# Patient Record
Sex: Female | Born: 1985 | Race: Black or African American | Hispanic: No | Marital: Single | Smoking: Never smoker
Health system: Southern US, Community
[De-identification: ages and names within clinical notes are randomized; demographics above are authoritative.]

## PROBLEM LIST (undated history)

## (undated) DIAGNOSIS — E039 Hypothyroidism, unspecified: Secondary | ICD-10-CM

## (undated) DIAGNOSIS — D649 Anemia, unspecified: Secondary | ICD-10-CM

## (undated) DIAGNOSIS — F419 Anxiety disorder, unspecified: Secondary | ICD-10-CM

## (undated) DIAGNOSIS — C541 Malignant neoplasm of endometrium: Secondary | ICD-10-CM

## (undated) DIAGNOSIS — G8929 Other chronic pain: Secondary | ICD-10-CM

## (undated) DIAGNOSIS — M549 Dorsalgia, unspecified: Secondary | ICD-10-CM

## (undated) DIAGNOSIS — G43909 Migraine, unspecified, not intractable, without status migrainosus: Secondary | ICD-10-CM

## (undated) HISTORY — PX: ELBOW FRACTURE SURGERY: SHX616

## (undated) HISTORY — PX: ABDOMINAL HYSTERECTOMY: SHX81

---
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
## 2005-01-21 ENCOUNTER — Observation Stay: Payer: Self-pay

## 2005-02-03 ENCOUNTER — Observation Stay: Payer: Self-pay

## 2005-02-18 ENCOUNTER — Observation Stay: Payer: Self-pay

## 2005-03-08 ENCOUNTER — Observation Stay: Payer: Self-pay | Admitting: Obstetrics and Gynecology

## 2005-03-14 ENCOUNTER — Observation Stay: Payer: Self-pay | Admitting: Obstetrics and Gynecology

## 2005-03-16 ENCOUNTER — Observation Stay: Payer: Self-pay | Admitting: Obstetrics and Gynecology

## 2005-03-19 ENCOUNTER — Observation Stay: Payer: Self-pay | Admitting: Obstetrics and Gynecology

## 2005-03-22 ENCOUNTER — Observation Stay: Payer: Self-pay

## 2005-03-24 ENCOUNTER — Observation Stay: Payer: Self-pay | Admitting: Obstetrics and Gynecology

## 2005-03-25 ENCOUNTER — Inpatient Hospital Stay: Payer: Self-pay | Admitting: Obstetrics and Gynecology

## 2005-09-21 ENCOUNTER — Emergency Department: Payer: Self-pay | Admitting: Emergency Medicine

## 2006-01-20 ENCOUNTER — Emergency Department: Payer: Self-pay | Admitting: Unknown Physician Specialty

## 2006-02-01 ENCOUNTER — Emergency Department: Payer: Self-pay | Admitting: Emergency Medicine

## 2006-02-02 ENCOUNTER — Ambulatory Visit: Payer: Self-pay | Admitting: Emergency Medicine

## 2006-02-27 ENCOUNTER — Observation Stay: Payer: Self-pay | Admitting: Obstetrics and Gynecology

## 2006-10-15 ENCOUNTER — Emergency Department: Payer: Self-pay | Admitting: Emergency Medicine

## 2007-01-02 ENCOUNTER — Emergency Department: Payer: Self-pay

## 2007-03-17 ENCOUNTER — Emergency Department: Payer: Self-pay | Admitting: Emergency Medicine

## 2007-04-03 ENCOUNTER — Emergency Department: Payer: Self-pay | Admitting: Emergency Medicine

## 2007-06-14 ENCOUNTER — Emergency Department: Payer: Self-pay | Admitting: Emergency Medicine

## 2007-06-29 ENCOUNTER — Observation Stay: Payer: Self-pay | Admitting: Obstetrics and Gynecology

## 2007-10-13 ENCOUNTER — Observation Stay: Payer: Self-pay

## 2007-11-01 ENCOUNTER — Inpatient Hospital Stay: Payer: Self-pay

## 2008-07-11 IMAGING — US US OB < 14 WEEKS - US OB TV
1 series · 17 of 28 positions shown · non-contrast
Comparison: none

REASON FOR EXAM: pelvic pain - 8 weeks pregnant - no bleeding
COMMENTS:

[Series 1: us ob < 14 weeks - us ob tv · 17 of 32 slices shown]
[im 1/32]
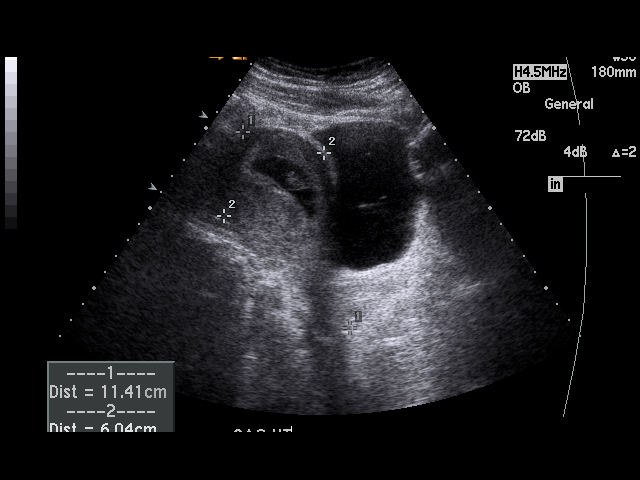
[im 3/32]
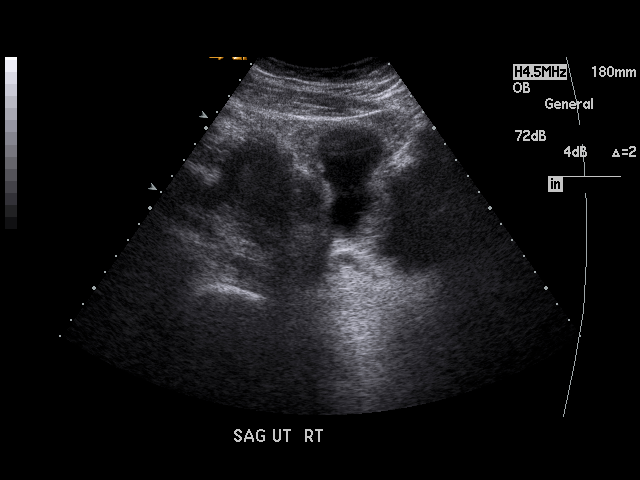
[im 5/32]
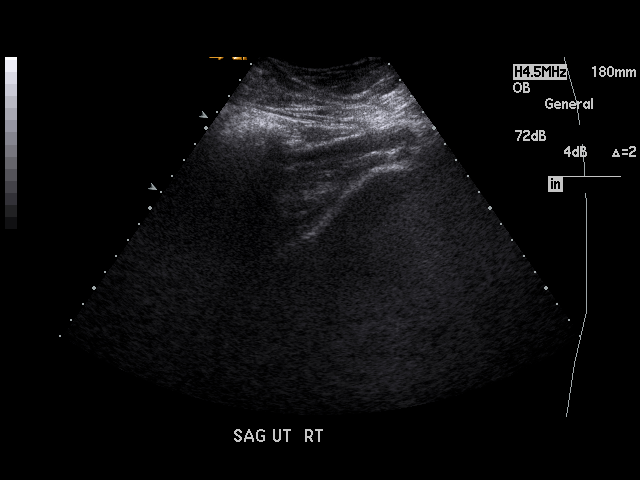
[im 6/32]
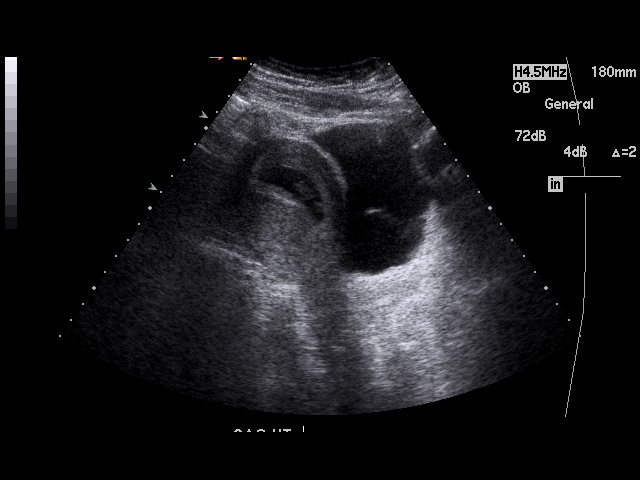
[im 9/32]
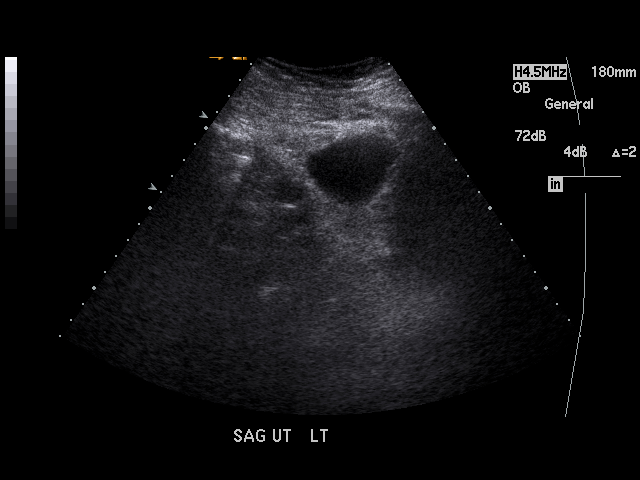
[im 11/32]
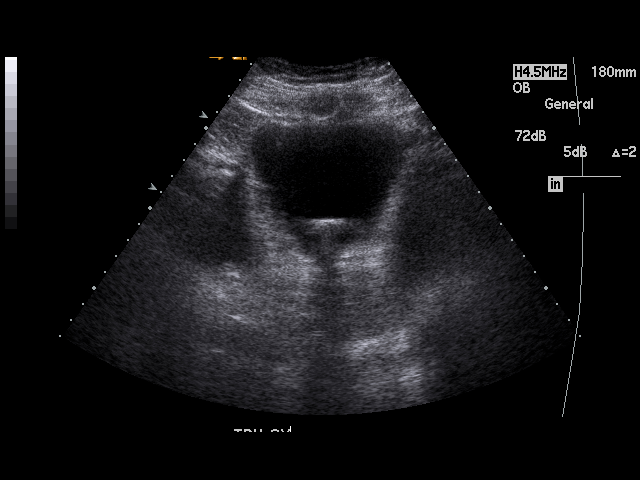
[im 12/32]
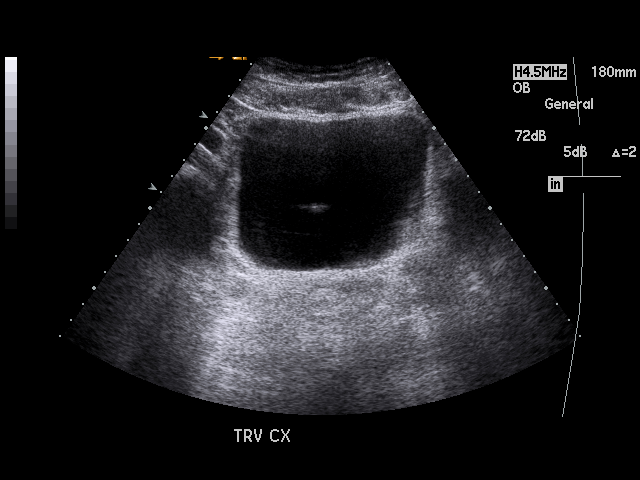
[im 14/32]
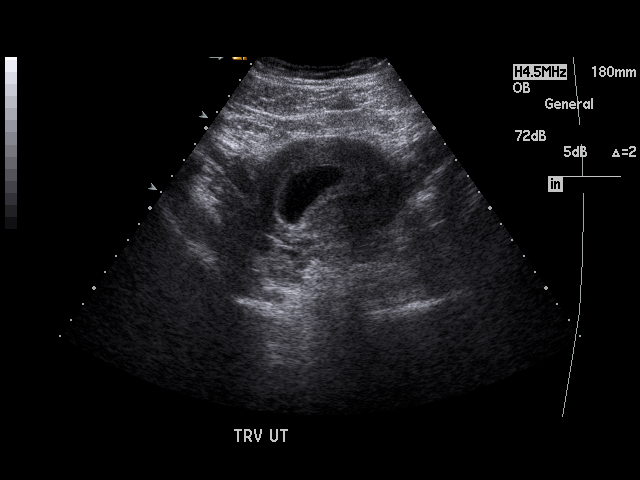
[im 17/32]
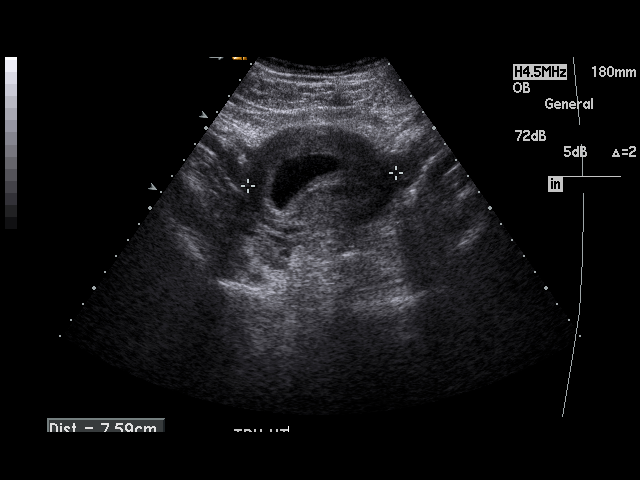
[im 18/32]
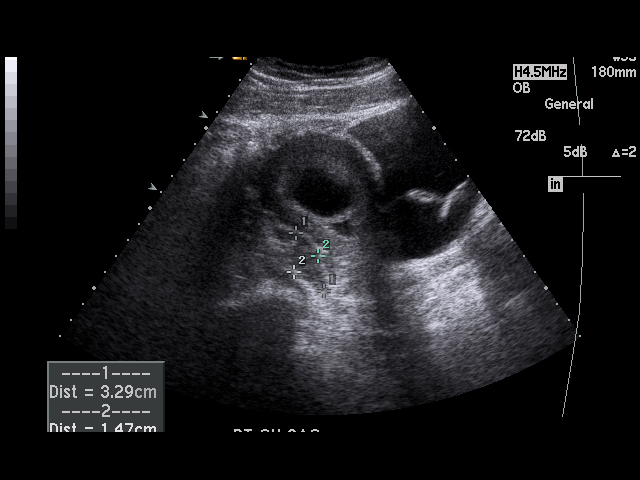
[im 20/32]
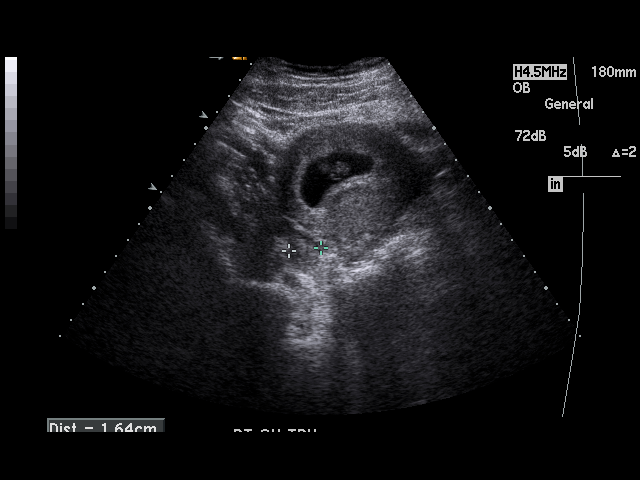
[im 21/32]
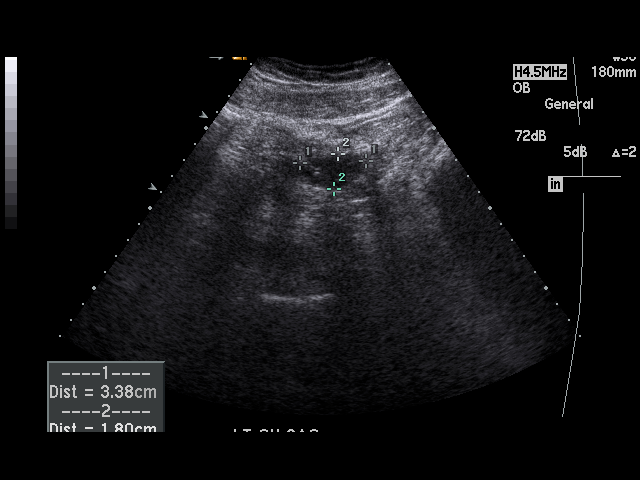
[im 23/32]
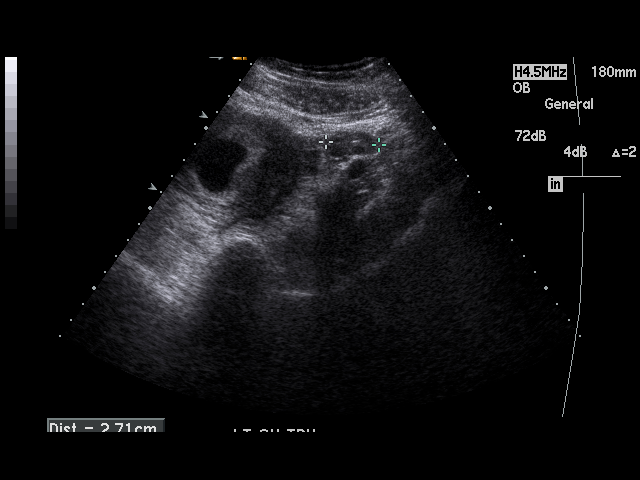
[im 26/32]
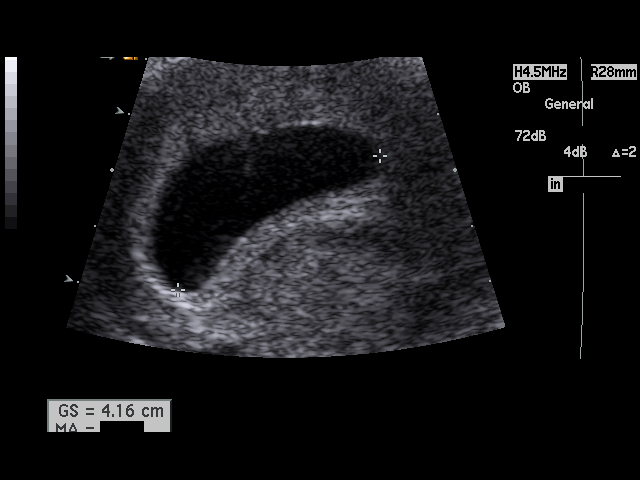
[im 27/32]
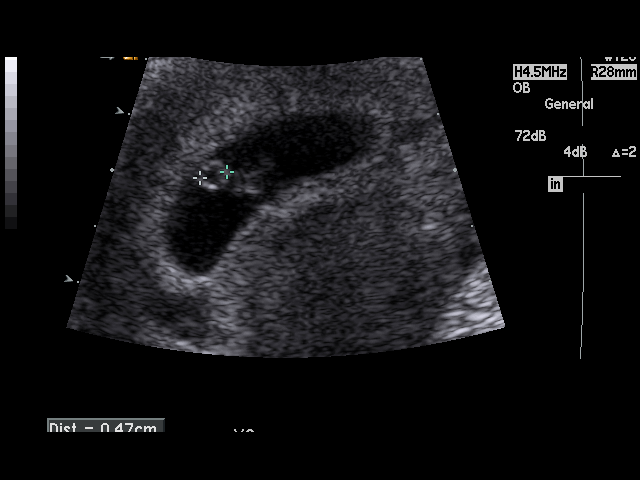
[im 29/32]
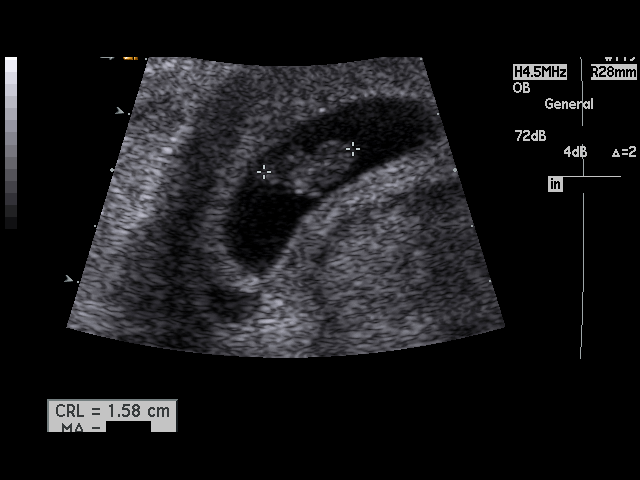
[im 32/32]
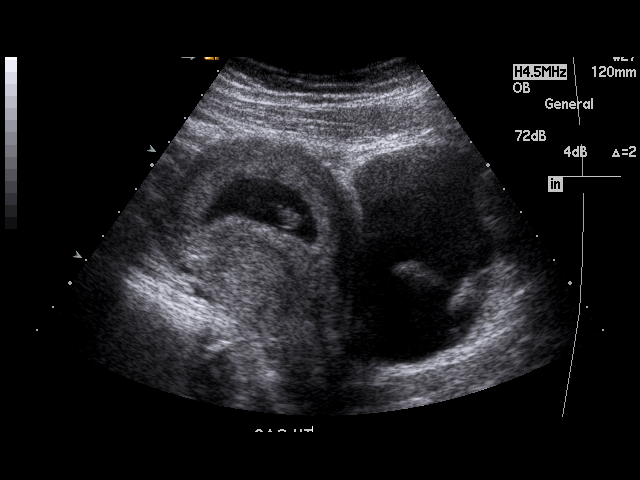

[17 of 28 positions shown; findings below may reference images not displayed]

PROCEDURE:     US  - US OB LESS THAN 14 WEEKS  - April 03, 2007  [DATE]

RESULT:       An intrauterine gestational sac is appreciated demonstrating a
single viable intrauterine pregnancy with a crown-rump length of 1.59 cm
corresponding to 8 weeks 0 days. Mean sac diameter is 4.21 cm corresponding
to 9 weeks 4 days. Yolk sac diameter is 4.0 cm. Estimated fetal heart rate
is 150 beats per minute.  The RIGHT ovary measures 3.29 x 1.47 x 1.64 cm and
the LEFT ovary measures 3.38 x 1.8 x 2.7 cm. There does not appear to be
evidence of pelvic masses, free fluid or drainable loculated fluid
collections.
IMPRESSION: Single viable intrauterine pregnancy with an estimated gestational age of 8
weeks 6 days.

Dr. Jama of the Emergency Department was informed of these findings at
the time of the initial interpretation.

## 2008-09-25 ENCOUNTER — Emergency Department: Payer: Self-pay | Admitting: Emergency Medicine

## 2008-10-19 ENCOUNTER — Encounter: Payer: Self-pay | Admitting: Maternal & Fetal Medicine

## 2008-11-06 ENCOUNTER — Emergency Department: Payer: Self-pay | Admitting: Emergency Medicine

## 2008-11-24 ENCOUNTER — Ambulatory Visit: Payer: Self-pay | Admitting: Obstetrics & Gynecology

## 2008-11-28 ENCOUNTER — Observation Stay: Payer: Self-pay

## 2008-12-27 ENCOUNTER — Observation Stay: Payer: Self-pay | Admitting: Obstetrics and Gynecology

## 2009-03-11 ENCOUNTER — Observation Stay: Payer: Self-pay | Admitting: Unknown Physician Specialty

## 2009-04-01 ENCOUNTER — Observation Stay: Payer: Self-pay

## 2009-04-06 ENCOUNTER — Observation Stay: Payer: Self-pay | Admitting: Obstetrics and Gynecology

## 2009-04-10 ENCOUNTER — Observation Stay: Payer: Self-pay

## 2009-04-16 ENCOUNTER — Inpatient Hospital Stay: Payer: Self-pay | Admitting: Obstetrics & Gynecology

## 2010-04-27 ENCOUNTER — Emergency Department: Payer: Self-pay | Admitting: Emergency Medicine

## 2010-06-23 ENCOUNTER — Emergency Department: Payer: Self-pay | Admitting: Emergency Medicine

## 2010-07-15 ENCOUNTER — Ambulatory Visit: Payer: Self-pay | Admitting: Family Medicine

## 2010-10-03 ENCOUNTER — Ambulatory Visit: Payer: Self-pay | Admitting: Family Medicine

## 2010-10-18 ENCOUNTER — Observation Stay: Payer: Self-pay | Admitting: Obstetrics and Gynecology

## 2010-11-05 ENCOUNTER — Observation Stay: Payer: Self-pay

## 2010-12-01 ENCOUNTER — Observation Stay: Payer: Self-pay

## 2010-12-09 ENCOUNTER — Observation Stay: Payer: Self-pay | Admitting: Obstetrics & Gynecology

## 2010-12-10 ENCOUNTER — Inpatient Hospital Stay: Payer: Self-pay | Admitting: Obstetrics and Gynecology

## 2010-12-13 LAB — PATHOLOGY REPORT

## 2011-10-05 ENCOUNTER — Emergency Department: Payer: Self-pay | Admitting: *Deleted

## 2011-10-05 LAB — PREGNANCY, URINE: Pregnancy Test, Urine: NEGATIVE m[IU]/mL

## 2011-10-08 ENCOUNTER — Ambulatory Visit: Payer: Self-pay | Admitting: Urology

## 2011-10-27 ENCOUNTER — Ambulatory Visit: Payer: Self-pay | Admitting: Family Medicine

## 2012-02-23 ENCOUNTER — Encounter: Payer: Self-pay | Admitting: Family Medicine

## 2012-05-19 ENCOUNTER — Ambulatory Visit: Payer: Self-pay | Admitting: Pain Medicine

## 2012-05-31 ENCOUNTER — Encounter: Payer: Self-pay | Admitting: Family Medicine

## 2012-07-03 ENCOUNTER — Emergency Department: Payer: Self-pay | Admitting: Emergency Medicine

## 2012-10-14 ENCOUNTER — Emergency Department: Payer: Self-pay | Admitting: Emergency Medicine

## 2012-10-29 ENCOUNTER — Emergency Department: Payer: Self-pay | Admitting: Emergency Medicine

## 2014-06-29 ENCOUNTER — Emergency Department: Payer: Self-pay | Admitting: Emergency Medicine

## 2014-07-05 ENCOUNTER — Ambulatory Visit: Payer: Self-pay | Admitting: Specialist

## 2014-10-08 NOTE — Op Note (Signed)
PATIENT NAME:  Christine Anderson, Christine Anderson MR#:  [id number] DATE OF BIRTH:  1985/11/11  DATE OF PROCEDURE:  07/05/2014  PREOPERATIVE DIAGNOSIS: Displaced right olecranon fracture.  POSTOPERATIVE DIAGNOSIS: Displaced right olecranon fracture.    PROCEDURE PERFORMED: Open reduction and internal fixation, right olecranon fracture.   SURGEON: Park Breed, MD   ANESTHESIA: General endotracheal.   COMPLICATIONS: None.   DRAINS: One vessel loop.   ESTIMATED BLOOD LOSS: Minimal.   REPLACED: None.   DESCRIPTION OF PROCEDURE: The patient was brought to the Operating Room where she underwent satisfactory general endotracheal anesthesia in the supine position. The right arm was prepped and draped in sterile fashion. An Esmarch was applied. The tourniquet was inflated to 250 mmHg. Tourniquet time was 65 minutes. A longitudinal incision was made along the proximal posterior border of the ulna, extending proximally and radially around the olecranon, and then toward the midline above the elbow. Dissection was carried out sharply through the subcutaneous tissue, which was quite edematous. Dissection was brought down to the posterior border of the ulna, and the muscle was released medially and laterally. Periosteal elevator was used to carefully elevate this, protecting the nerve. The fracture site was identified and hematoma was removed with irrigation, rongeur, and curettes. Once this was completed, the fracture reduction was undertaken. The fracture fortunately was not very comminuted, and was reducible and in good position. A large bone-holding clamp was used to hold this in place.   Two 0.063 K wires were then drilled through the back of the olecranon, down the shaft of the ulna. When these were seen to be in good position under fluoroscopic visualization, a drill hole was made in the ulnar shaft, about 2-1/2 inches distal to the fracture line medially and laterally. A 20-gauge wire was passed through these  holes, and then brought up in a figure-of-eight tension-band fashion around the K wires proximally. Two wire loops were created medially and laterally along the olecranon, and the wire was gradually tightened while the fracture was held reduced. The wire was carefully tighten, both medial and laterally, and it was quite snug. This allowed good flexion of the elbow and extension without significant loosening of the wire. The K wires were then bent down and cut, and a mallet used to drive the wires down into the soft tissues so they were not exposed. Fluoroscopy showed the fracture to be well reduced, and the wires to be in good position.   The wounds were then irrigated. The subcutaneous tissue was closed with 2-0 and 3-0 Vicryl, and the skin was closed with staples over a vessel loop drain.  Marcaine 0.5% was placed in the wound. A dry, sterile compression dressing with a posterior splint was applied. The tourniquet was deflated with good return of blood flow to the hand. The patient was placed in a sling, and awakened and taken to recovery in good condition.   The patient was given pain medication yesterday in the office, and in addition will be given meloxicam and gabapentin to take home with him today, as well.    ____________________________ Park Breed, MD hem:MT D: 07/05/2014 10:55:19 ET T: 07/05/2014 14:37:03 ET JOB#: [id number]  cc: Park Breed, MD, <Dictator> Park Breed MD ELECTRONICALLY SIGNED 07/07/2014 14:43

## 2015-02-21 ENCOUNTER — Encounter: Payer: Self-pay | Admitting: Emergency Medicine

## 2015-02-21 ENCOUNTER — Emergency Department
Admission: EM | Admit: 2015-02-21 | Discharge: 2015-02-21 | Disposition: A | Discharge status: Home / Self Care | Payer: Medicaid Other | Attending: Emergency Medicine | Admitting: Emergency Medicine

## 2015-02-21 DIAGNOSIS — E669 Obesity, unspecified: Secondary | ICD-10-CM | POA: Insufficient documentation

## 2015-02-21 DIAGNOSIS — L03115 Cellulitis of right lower limb: Secondary | ICD-10-CM | POA: Insufficient documentation

## 2015-02-21 DIAGNOSIS — L03116 Cellulitis of left lower limb: Secondary | ICD-10-CM | POA: Diagnosis not present

## 2015-02-21 DIAGNOSIS — M79605 Pain in left leg: Secondary | ICD-10-CM | POA: Diagnosis present

## 2015-02-21 HISTORY — DX: Other chronic pain: G89.29

## 2015-02-21 HISTORY — DX: Dorsalgia, unspecified: M54.9

## 2015-02-21 LAB — CBC WITH DIFFERENTIAL/PLATELET
BASOS PCT: 0 %
Basophils Absolute: 0 10*3/uL (ref 0–0.1)
EOS ABS: 0.1 10*3/uL (ref 0–0.7)
EOS PCT: 1 %
HCT: 35.9 % (ref 35.0–47.0)
Hemoglobin: 11.3 g/dL — ABNORMAL LOW (ref 12.0–16.0)
LYMPHS ABS: 2.2 10*3/uL (ref 1.0–3.6)
Lymphocytes Relative: 26 %
MCH: 24.6 pg — AB (ref 26.0–34.0)
MCHC: 31.3 g/dL — AB (ref 32.0–36.0)
MCV: 78.6 fL — ABNORMAL LOW (ref 80.0–100.0)
MONOS PCT: 7 %
Monocytes Absolute: 0.6 10*3/uL (ref 0.2–0.9)
NEUTROS PCT: 66 %
Neutro Abs: 5.6 10*3/uL (ref 1.4–6.5)
PLATELETS: 285 10*3/uL (ref 150–440)
RBC: 4.57 MIL/uL (ref 3.80–5.20)
RDW: 14 % (ref 11.5–14.5)
WBC: 8.5 10*3/uL (ref 3.6–11.0)

## 2015-02-21 LAB — BASIC METABOLIC PANEL
Anion gap: 7 (ref 5–15)
BUN: 9 mg/dL (ref 6–20)
CO2: 27 mmol/L (ref 22–32)
CREATININE: 0.86 mg/dL (ref 0.44–1.00)
Calcium: 9.2 mg/dL (ref 8.9–10.3)
Chloride: 104 mmol/L (ref 101–111)
Glucose, Bld: 95 mg/dL (ref 65–99)
Potassium: 3.5 mmol/L (ref 3.5–5.1)
SODIUM: 138 mmol/L (ref 135–145)

## 2015-02-21 MED ORDER — ACETAMINOPHEN 500 MG PO TABS
ORAL_TABLET | ORAL | Status: DC
Start: 2015-02-21 — End: 2015-02-22
  Filled 2015-02-21: qty 2

## 2015-02-21 MED ORDER — OXYCODONE-ACETAMINOPHEN 5-325 MG PO TABS
2.0000 | ORAL_TABLET | Freq: Once | ORAL | Status: DC
  Filled 2015-02-21: qty 2

## 2015-02-21 MED ORDER — DOXYCYCLINE HYCLATE 100 MG PO CAPS: ORAL_CAPSULE | ORAL | Status: DC

## 2015-02-21 MED ORDER — CEPHALEXIN 500 MG PO CAPS: 500.0000 mg | ORAL_CAPSULE | Freq: Four times a day (QID) | ORAL | Status: DC

## 2015-02-21 MED ORDER — DOXYCYCLINE HYCLATE 100 MG PO TABS
100.0000 mg | ORAL_TABLET | Freq: Once | ORAL | Status: AC
  Administered 2015-02-21: 100 mg via ORAL
  Filled 2015-02-21 (×2): qty 1

## 2015-02-21 MED ORDER — CEPHALEXIN 500 MG PO CAPS
500.0000 mg | ORAL_CAPSULE | Freq: Once | ORAL | Status: AC
  Administered 2015-02-21: 500 mg via ORAL
  Filled 2015-02-21 (×2): qty 1

## 2015-02-21 MED ORDER — ACETAMINOPHEN 500 MG PO TABS
1000.0000 mg | ORAL_TABLET | Freq: Once | ORAL | Status: AC
  Administered 2015-02-21: 1000 mg via ORAL

## 2015-02-21 NOTE — Discharge Instructions (Signed)
As we discussed, we believe that the redness and pain in your legs are caused by a mild skin infection.  I prescribed 2 antibiotics for human encouraged to take both of them for the full course of treatment.  Please take your regular pain medication and follow up with your regular doctor as soon as possible.  If you find that her symptoms are getting worse, or if he develop new symptoms that concern you, please return immediately to the emergency department.  As read through the included information for additional recommendations for treatments.   Cellulitis Cellulitis is an infection of the skin and the tissue under the skin. The infected area is usually red and tender. This happens most often in the arms and lower legs. HOME CARE   Take your antibiotic medicine as told. Finish the medicine even if you start to feel better.  Keep the infected arm or leg raised (elevated).  Put a warm cloth on the area up to 4 times per day.  Only take medicines as told by your doctor.  Keep all doctor visits as told. GET HELP IF:  You see red streaks on the skin coming from the infected area.  Your red area gets bigger or turns a dark color.  Your bone or joint under the infected area is painful after the skin heals.  Your infection comes back in the same area or different area.  You have a puffy (swollen) bump in the infected area.  You have new symptoms.  You have a fever. GET HELP RIGHT AWAY IF:   You feel very sleepy.  You throw up (vomit) or have watery poop (diarrhea).  You feel sick and have muscle aches and pains. MAKE SURE YOU:   Understand these instructions.  Will watch your condition.  Will get help right away if you are not doing well or get worse. Document Released: 11/12/2007 Document Revised: 10/10/2013 Document Reviewed: 08/11/2011 [facility] Patient Information 2015 [place], [place]. This information is not intended to replace advice given to you by your health care  provider. Make sure you discuss any questions you have with your health care provider.

## 2015-02-21 NOTE — ED Notes (Signed)
Pt to triage via w/c with no distress noted; pt reports redness to lower legs x 2 days with pain; denies any known injury; denies any accomp symtpoms

## 2015-02-21 NOTE — ED Provider Notes (Signed)
[facility] Emergency Department Provider Note  ____________________________________________  Time seen: Approximately 8:09 PM  I have reviewed the triage vital signs and the nursing notes.   HISTORY  Chief Complaint Leg Pain    HPI HADLIE GIPSON is a 29 y.o. female with history of chronic back pain who receives her pain medicines at the [facility] clinic and a history of obesity but otherwise is healthy who presents with 2 days of gradual onset but worsening redness to both of her lower legs with accompanying pain and warmth.  She has never had this happen before.  She states this started in her lower legs but has spread.  She has no pain in the backs of her knees and his never had any blood clots.  She denies fever/chills, chest pain, shortness of breath, nausea/vomiting, abdominal pain.  She notes that it did start after she shaved her legs but does not remember cutting or nicking herself.   Past Medical History  Diagnosis Date  . Chronic back pain     There are no active problems to display for this patient.   History reviewed. No pertinent past surgical history.  Current Outpatient Rx  Name  Route  Sig  Dispense  Refill  . cephALEXin (KEFLEX) 500 MG capsule   Oral   Take 1 capsule (500 mg total) by mouth 4 (four) times daily.   40 capsule   0   . doxycycline (VIBRAMYCIN) 100 MG capsule      Take 1 capsule (100 mg) by mouth twice daily for 10 days.   20 capsule   0     Allergies Flexeril; Septra; and Tramadol  No family history on file.  Social History Social History  Substance Use Topics  . Smoking status: Never Smoker   . Smokeless tobacco: None  . Alcohol Use: No    Review of Systems Constitutional: No fever/chills Eyes: No visual changes. ENT: No sore throat. Cardiovascular: Denies chest pain. Respiratory: Denies shortness of breath. Gastrointestinal: No abdominal pain.  No nausea, no vomiting.  No diarrhea.  No  constipation. Genitourinary: Negative for dysuria. Musculoskeletal: pain in her lower extremities with redness Skin: erythema in her lower extremities Neurological: Negative for headaches, focal weakness or numbness.  10-point ROS otherwise negative.  ____________________________________________   PHYSICAL EXAM:  VITAL SIGNS: ED Triage Vitals  Enc Vitals Group     BP 02/21/15 1912 108/51 mmHg     Pulse Rate 02/21/15 1912 84     Resp 02/21/15 1912 20     Temp 02/21/15 1912 97.6 F (36.4 C)     Temp Source 02/21/15 1912 Oral     SpO2 02/21/15 1912 99 %     Weight 02/21/15 1912 280 lb (127.007 kg)     Height 02/21/15 1912 5\' 8"  (1.727 m)     Head Cir --      Peak Flow --      Pain Score 02/21/15 1914 10     Pain Loc --      Pain Edu? --      Excl. in [place]? --     Constitutional: Alert and oriented. Well appearing and in no acute distress. Eyes: Conjunctivae are normal. PERRL. EOMI. Head: Atraumatic. Nose: No congestion/rhinnorhea. Mouth/Throat: Mucous membranes are moist.  Oropharynx non-erythematous. Neck: No stridor.   Cardiovascular: Normal rate, regular rhythm. Grossly normal heart sounds.  Good peripheral circulation. Respiratory: Normal respiratory effort.  No retractions. Lungs CTAB. Gastrointestinal: obese, Soft and nontender.  No distention. No abdominal bruits. No CVA tenderness. Musculoskeletal: no obvious edema in her lower extremities.mild tenderness to palpation but soft and easily compressible compartments.  No palpable cords or knots along her lower extremity venous distribution or popliteal regions. Neurologic:  Normal speech and language. No gross focal neurologic deficits are appreciated.  Skin:  Patchy erythema and warmth in her lower extremities including parts of her feet and lower extremities but not extending proximally past her knee consistent with cellulitis.  Pain is minimal and not consistent with necrotizing fasciitis. Psychiatric: Mood and affect  are normal. Speech and behavior are normal.  ____________________________________________   LABS (all labs ordered are listed, but only abnormal results are displayed)  Labs Reviewed  CBC WITH DIFFERENTIAL/PLATELET - Abnormal; Notable for the following:    Hemoglobin 11.3 (*)    MCV 78.6 (*)    MCH 24.6 (*)    MCHC 31.3 (*)    All other components within normal limits  BASIC METABOLIC PANEL   ____________________________________________  EKG  Not indicated ____________________________________________  RADIOLOGY   Not indicated  ____________________________________________   PROCEDURES  Procedure(s) performed: None  Critical Care performed: No ____________________________________________   INITIAL IMPRESSION / ASSESSMENT AND PLAN / ED COURSE  Pertinent labs & imaging results that were available during my care of the patient were reviewed by me and considered in my medical decision making (see chart for details).  Though it is unusual to have cellulitis in both extremities is not unheard of.  There is nothing about her presentation with normal vital signs and no fever and minimal pain to suggest necrotizing fasciitis.  There is also nothing to suggest that this is the result of a DVT.  I will treat her empirically with broad coverage for strep and staph species including MRSA with Keflex and doxycycline (she has a listed allergy to sulfa drugs).  I gave her my usual customary return precautions.  Given that she is on chronic pain medicine from a primary care doctor I will not prescribe pain medication at this time but I gave her a dose while in the emergency department.  I drew baseline labs in case she has to come back and I did give her my usual and customary return precautions.  ____________________________________________  FINAL CLINICAL IMPRESSION(S) / ED DIAGNOSES  Final diagnoses:  Cellulitis of leg, left  Cellulitis of leg, right      NEW MEDICATIONS  STARTED DURING THIS VISIT:  New Prescriptions   CEPHALEXIN (KEFLEX) 500 MG CAPSULE    Take 1 capsule (500 mg total) by mouth 4 (four) times daily.   DOXYCYCLINE (VIBRAMYCIN) 100 MG CAPSULE    Take 1 capsule (100 mg) by mouth twice daily for 10 days.     Christine Kehr, MD 02/21/15 2110

## 2015-08-30 ENCOUNTER — Encounter: Payer: Self-pay | Admitting: Emergency Medicine

## 2015-08-30 ENCOUNTER — Emergency Department
Admission: EM | Admit: 2015-08-30 | Discharge: 2015-08-30 | Disposition: A | Discharge status: Home / Self Care | Payer: Medicaid Other | Attending: Emergency Medicine | Admitting: Emergency Medicine

## 2015-08-30 DIAGNOSIS — M549 Dorsalgia, unspecified: Secondary | ICD-10-CM | POA: Diagnosis not present

## 2015-08-30 DIAGNOSIS — K5289 Other specified noninfective gastroenteritis and colitis: Secondary | ICD-10-CM | POA: Insufficient documentation

## 2015-08-30 DIAGNOSIS — Z79899 Other long term (current) drug therapy: Secondary | ICD-10-CM | POA: Diagnosis not present

## 2015-08-30 DIAGNOSIS — G8929 Other chronic pain: Secondary | ICD-10-CM | POA: Diagnosis not present

## 2015-08-30 DIAGNOSIS — K529 Noninfective gastroenteritis and colitis, unspecified: Secondary | ICD-10-CM

## 2015-08-30 DIAGNOSIS — R112 Nausea with vomiting, unspecified: Secondary | ICD-10-CM | POA: Diagnosis present

## 2015-08-30 MED ORDER — ONDANSETRON 4 MG PO TBDP
4.0000 mg | ORAL_TABLET | Freq: Once | ORAL | Status: AC
  Administered 2015-08-30: 4 mg via ORAL
  Filled 2015-08-30: qty 1

## 2015-08-30 MED ORDER — ONDANSETRON HCL 4 MG PO TABS: 4.0000 mg | ORAL_TABLET | Freq: Every day | ORAL | Status: AC | PRN

## 2015-08-30 MED ORDER — IBUPROFEN 800 MG PO TABS: 800.0000 mg | ORAL_TABLET | Freq: Three times a day (TID) | ORAL | Status: DC | PRN

## 2015-08-30 MED ORDER — IBUPROFEN 800 MG PO TABS
800.0000 mg | ORAL_TABLET | Freq: Once | ORAL | Status: AC
  Administered 2015-08-30: 800 mg via ORAL
  Filled 2015-08-30: qty 1

## 2015-08-30 NOTE — ED Notes (Signed)
States she developed the n/v sx's 2 days ago  Unsure of fever.. States she vomited around 10 am this am

## 2015-08-30 NOTE — ED Notes (Signed)
Presents with a 2 day hx of vomiting   Last time vomited was this am   Having some stomach cramping

## 2015-08-30 NOTE — Discharge Instructions (Signed)
Continue nausea medicine as directed. Begin ibuprofen as needed. Try to drink liquids and stay on bland diet until symptoms resolve. Return to emergency for any worsening symptoms.

## 2015-08-30 NOTE — ED Provider Notes (Signed)
[facility] Emergency Department Provider Note  ____________________________________________  Time seen: Approximately 7:40 PM  I have reviewed the triage vital signs and the nursing notes.   HISTORY  Chief Complaint Emesis    HPI Christine Anderson is a 30 y.o. female who presents with 2 days of nausea vomiting and diarrhea. Last episode of vomiting was earlier today. She also had diarrhea earlier today. She has occasional abdominal cramping. Has felt feverish at times. She also has a headache. No cough, congestion.   Past Medical History  Diagnosis Date  . Chronic back pain     There are no active problems to display for this patient.   History reviewed. No pertinent past surgical history.  Current Outpatient Rx  Name  Route  Sig  Dispense  Refill  . levothyroxine (SYNTHROID, LEVOTHROID) 100 MCG tablet   Oral   Take 100 mcg by mouth daily before breakfast.         . oxyCODONE-acetaminophen (PERCOCET) 7.5-325 MG tablet   Oral   Take 1 tablet by mouth every 4 (four) hours as needed for severe pain.         Marland Kitchen sertraline (ZOLOFT) 25 MG tablet   Oral   Take 25 mg by mouth daily.         . SUMAtriptan (IMITREX) 100 MG tablet   Oral   Take 100 mg by mouth every 2 (two) hours as needed for migraine. May repeat in 2 hours if headache persists or recurs.         . cephALEXin (KEFLEX) 500 MG capsule   Oral   Take 1 capsule (500 mg total) by mouth 4 (four) times daily.   40 capsule   0   . doxycycline (VIBRAMYCIN) 100 MG capsule      Take 1 capsule (100 mg) by mouth twice daily for 10 days.   20 capsule   0   . ibuprofen (ADVIL,MOTRIN) 800 MG tablet   Oral   Take 1 tablet (800 mg total) by mouth every 8 (eight) hours as needed.   15 tablet   0   . ondansetron (ZOFRAN) 4 MG tablet   Oral   Take 1 tablet (4 mg total) by mouth daily as needed for nausea or vomiting.   10 tablet   0     Allergies Flexeril; Septra; and  Tramadol  No family history on file.  Social History Social History  Substance Use Topics  . Smoking status: Never Smoker   . Smokeless tobacco: None  . Alcohol Use: No    Review of Systems Constitutional: fever/chills Eyes: No visual changes. ENT: No sore throat. Cardiovascular: Denies chest pain. Respiratory: Denies shortness of breath. Gastrointestinal per history of present illness Genitourinary: Negative for dysuria. Musculoskeletal: Negative for back pain. Skin: Negative for rash. Neurological: Negative for focal weakness or numbness. 10-point ROS otherwise negative.  ____________________________________________   PHYSICAL EXAM:  VITAL SIGNS: ED Triage Vitals  Enc Vitals Group     BP 08/30/15 1856 134/82 mmHg     Pulse Rate 08/30/15 1856 94     Resp 08/30/15 1856 18     Temp 08/30/15 1856 98.4 F (36.9 C)     Temp Source 08/30/15 1856 Oral     SpO2 08/30/15 1856 98 %     Weight 08/30/15 1856 295 lb (133.811 kg)     Height 08/30/15 1856 5\' 8"  (1.727 m)     Head Cir --  Peak Flow --      Pain Score 08/30/15 1902 4     Pain Loc --      Pain Edu? --      Excl. in [place]? --     Constitutional: Alert and oriented. Well appearing and in no acute distress. Eyes: Conjunctivae are normal. PERRL. EOMI. Ears:  Clear with normal landmarks. No erythema. Head: Atraumatic. Nose: No congestion/rhinnorhea. Mouth/Throat: Mucous membranes are moist.  Oropharynx non-erythematous. No lesions. Neck:  Supple.  No adenopathy.   Cardiovascular: Normal rate, regular rhythm. Grossly normal heart sounds.  Good peripheral circulation. Respiratory: Normal respiratory effort.  No retractions. Lungs CTAB. Gastrointestinal: Soft and nontender. No distention. No abdominal bruits. No CVA tenderness. Musculoskeletal: Nml ROM of upper and lower extremity joints. Neurologic:  Normal speech and language. No gross focal neurologic deficits are appreciated. No gait instability. Skin:  Skin  is warm, dry and intact. No rash noted. Psychiatric: Mood and affect are normal. Speech and behavior are normal.  ____________________________________________   LABS (all labs ordered are listed, but only abnormal results are displayed)  Labs Reviewed - No data to display ____________________________________________  EKG   ____________________________________________  RADIOLOGY   ____________________________________________   PROCEDURES  Procedure(s) performed: None  Critical Care performed: No  ____________________________________________   INITIAL IMPRESSION / ASSESSMENT AND PLAN / ED COURSE  Pertinent labs & imaging results that were available during my care of the patient were reviewed by me and considered in my medical decision making (see chart for details).  30 year old with acute onset of nausea, vomiting, diarrhea. Improved. Given Zofran and ibuprofen emergency room. She is also given prescriptions for this. Return to emergency room if not improving. ____________________________________________   FINAL CLINICAL IMPRESSION(S) / ED DIAGNOSES  Final diagnoses:  Gastroenteritis      Mortimer Fries, PA-C 08/30/15 2059  Hinda Kehr, MD 08/31/15 1126

## 2015-09-24 ENCOUNTER — Emergency Department: Payer: No Typology Code available for payment source

## 2015-09-24 ENCOUNTER — Emergency Department
Admission: EM | Admit: 2015-09-24 | Discharge: 2015-09-24 | Disposition: A | Discharge status: Home / Self Care | Payer: No Typology Code available for payment source | Attending: Student | Admitting: Student

## 2015-09-24 ENCOUNTER — Encounter: Payer: Self-pay | Admitting: Emergency Medicine

## 2015-09-24 DIAGNOSIS — Y939 Activity, unspecified: Secondary | ICD-10-CM | POA: Diagnosis not present

## 2015-09-24 DIAGNOSIS — Y999 Unspecified external cause status: Secondary | ICD-10-CM | POA: Insufficient documentation

## 2015-09-24 DIAGNOSIS — S6000XA Contusion of unspecified finger without damage to nail, initial encounter: Secondary | ICD-10-CM

## 2015-09-24 DIAGNOSIS — S5011XA Contusion of right forearm, initial encounter: Secondary | ICD-10-CM | POA: Insufficient documentation

## 2015-09-24 DIAGNOSIS — S60221A Contusion of right hand, initial encounter: Secondary | ICD-10-CM | POA: Insufficient documentation

## 2015-09-24 DIAGNOSIS — Y929 Unspecified place or not applicable: Secondary | ICD-10-CM | POA: Insufficient documentation

## 2015-09-24 DIAGNOSIS — S59911A Unspecified injury of right forearm, initial encounter: Secondary | ICD-10-CM | POA: Diagnosis present

## 2015-09-24 MED ORDER — HYDROCODONE-ACETAMINOPHEN 5-325 MG PO TABS: 1.0000 | ORAL_TABLET | Freq: Once | ORAL | Status: DC

## 2015-09-24 MED ORDER — HYDROCODONE-ACETAMINOPHEN 5-325 MG PO TABS
ORAL_TABLET | ORAL | Status: DC
Start: 2015-09-24 — End: 2015-09-24
  Filled 2015-09-24: qty 1
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
## 2015-09-24 NOTE — Discharge Instructions (Signed)
Contusion A contusion is a deep bruise. Contusions happen when an injury causes bleeding under the skin. Symptoms of bruising include pain, swelling, and discolored skin. The skin may turn blue, purple, or yellow. HOME CARE   Rest the injured area.  If told, put ice on the injured area.  Put ice in a plastic bag.  Place a towel between your skin and the bag.  Leave the ice on for 20 minutes, 2-3 times per day.  If told, put light pressure (compression) on the injured area using an elastic bandage. Make sure the bandage is not too tight. Remove it and put it back on as told by your doctor.  If possible, raise (elevate) the injured area above the level of your heart while you are sitting or lying down.  Take over-the-counter and prescription medicines only as told by your doctor. GET HELP IF:  Your symptoms do not get better after several days of treatment.  Your symptoms get worse.  You have trouble moving the injured area. GET HELP RIGHT AWAY IF:   You have very bad pain.  You have a loss of feeling (numbness) in a hand or foot.  Your hand or foot turns pale or cold.   This information is not intended to replace advice given to you by your health care provider. Make sure you discuss any questions you have with your health care provider.   Document Released: 11/12/2007 Document Revised: 02/14/2015 Document Reviewed: 10/11/2014 Else[employer] Interactive Patient Education 2016 [employer]  Cryotherapy Cryotherapy is when you put ice on your injury. Ice helps lessen pain and puffiness (swelling) after an injury. Ice works the best when you start using it in the first 24 to 48 hours after an injury. HOME CARE  Put a dry or damp towel between the ice pack and your skin.  You may press gently on the ice pack.  Leave the ice on for no more than 10 to 20 minutes at a time.  Check your skin after 5 minutes to make sure your skin is okay.  Rest at least 20 minutes between ice  pack uses.  Stop using ice when your skin loses feeling (numbness).  Do not use ice on someone who cannot tell you when it hurts. This includes small children and people with memory problems (dementia). GET HELP RIGHT AWAY IF:  You have white spots on your skin.  Your skin turns blue or pale.  Your skin feels waxy or hard.  Your puffiness gets worse. MAKE SURE YOU:   Understand these instructions.  Will watch your condition.  Will get help right away if you are not doing well or get worse.   This information is not intended to replace advice given to you by your health care provider. Make sure you discuss any questions you have with your health care provider.   Document Released: 11/12/2007 Document Revised: 08/18/2011 Document Reviewed: 01/16/2011 Else[employer] Interactive Patient Education 2016 [place] taking your medication as your doctor has prescribed. Ice and elevate your hand and arm to reduce swelling. Wear sling for 2-3 days for supportive your arm. Follow-up with Dr. Sabra Heck if any continued problems with the arm or hand. You will need to call and make an appointment.
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
## 2016-04-18 ENCOUNTER — Emergency Department
Admission: EM | Admit: 2016-04-18 | Discharge: 2016-04-18 | Disposition: A | Discharge status: Home / Self Care | Payer: Medicaid Other | Attending: Emergency Medicine | Admitting: Emergency Medicine

## 2016-04-18 ENCOUNTER — Encounter: Payer: Self-pay | Admitting: Emergency Medicine

## 2016-04-18 DIAGNOSIS — M5441 Lumbago with sciatica, right side: Secondary | ICD-10-CM | POA: Diagnosis not present

## 2016-04-18 DIAGNOSIS — Z79899 Other long term (current) drug therapy: Secondary | ICD-10-CM | POA: Insufficient documentation

## 2016-04-18 DIAGNOSIS — M545 Low back pain: Secondary | ICD-10-CM | POA: Diagnosis present

## 2016-04-18 DIAGNOSIS — G8929 Other chronic pain: Secondary | ICD-10-CM

## 2016-04-18 HISTORY — DX: Migraine, unspecified, not intractable, without status migrainosus: G43.909

## 2016-04-18 MED ORDER — KETOROLAC TROMETHAMINE 10 MG PO TABS: 10.0000 mg | ORAL_TABLET | Freq: Four times a day (QID) | ORAL | 0 refills | Status: AC | PRN

## 2016-04-18 MED ORDER — KETOROLAC TROMETHAMINE 30 MG/ML IJ SOLN
30.0000 mg | Freq: Once | INTRAMUSCULAR | Status: DC
  Filled 2016-04-18: qty 1

## 2016-04-18 NOTE — ED Provider Notes (Signed)
[facility] Emergency Department Provider Note  ____________________________________________  Time seen: Approximately 4:44 PM  I have reviewed the triage vital signs and the nursing notes.   HISTORY  Chief Complaint Back Pain    HPI Christine Anderson is a 30 y.o. female , NAD, presents to the emergency department with 2-1/2 month history of lower back pain. Patient states she has chronic lower back pain with radiation into the right leg. Was being treated for such by her primary care provider and states she was receiving pain medications up until 3 months ago. States she was seen by a spine specialist yesterday and had an MRI completed but they did not give her any pain medications. States that her pain is worsening versus improving and she has not been able to sleep due to the pain. Denies any saddle paresthesias or loss of bowel or bladder control. No recent injuries or traumas. States she has been on multiple medications including steroids and muscle relaxers with nothing completely alleviating her pain.   Past Medical History:  Diagnosis Date  . Chronic back pain   . Migraines     There are no active problems to display for this patient.   History reviewed. No pertinent surgical history.  Prior to Admission medications   Medication Sig Start Date End Date Taking? Authorizing Provider  cephALEXin (KEFLEX) 500 MG capsule Take 1 capsule (500 mg total) by mouth 4 (four) times daily. 02/21/15   Hinda Kehr, MD  doxycycline (VIBRAMYCIN) 100 MG capsule Take 1 capsule (100 mg) by mouth twice daily for 10 days. 02/21/15   Hinda Kehr, MD  gabapentin (NEURONTIN) 400 MG capsule Take 400 mg by mouth 2 (two) times daily.    Historical Provider, MD  ibuprofen (ADVIL,MOTRIN) 800 MG tablet Take 1 tablet (800 mg total) by mouth every 8 (eight) hours as needed. 08/30/15   Mortimer Fries, PA-C  ketorolac (TORADOL) 10 MG tablet Take 1 tablet (10 mg total) by mouth every 6  (six) hours as needed for severe pain. Please take with food and may not take for longer than 5 days. Do not take with any other NSAIDS. May take Tylenol only while on this medication. 04/18/16 04/23/16  Khloi Rawl L Alora Gorey, PA-C  levothyroxine (SYNTHROID, LEVOTHROID) 100 MCG tablet Take 100 mcg by mouth daily before breakfast.    Historical Provider, MD  meloxicam (MOBIC) 15 MG tablet Take 15 mg by mouth daily.    Historical Provider, MD  ondansetron (ZOFRAN) 4 MG tablet Take 1 tablet (4 mg total) by mouth daily as needed for nausea or vomiting. 08/30/15 08/29/16  Mortimer Fries, PA-C  oxyCODONE-acetaminophen (PERCOCET) 7.5-325 MG tablet Take 1 tablet by mouth every 4 (four) hours as needed for severe pain.    Historical Provider, MD  sertraline (ZOLOFT) 25 MG tablet Take 25 mg by mouth daily.    Historical Provider, MD  SUMAtriptan (IMITREX) 100 MG tablet Take 100 mg by mouth every 2 (two) hours as needed for migraine. May repeat in 2 hours if headache persists or recurs.    Historical Provider, MD    Allergies Hydrocodone; Flexeril [cyclobenzaprine]; Septra [sulfamethoxazole-trimethoprim]; and Tramadol  History reviewed. No pertinent family history.  Social History Social History  Substance Use Topics  . Smoking status: Never Smoker  . Smokeless tobacco: Never Used  . Alcohol use No     Review of Systems  Constitutional: No fever/chills Cardiovascular: No chest pain. Respiratory: No shortness of breath.  Musculoskeletal: Positive for chronic back  pain with radiation into the right leg.  Skin: Negative for rash, redness, skin sores. Neurological: Negative for headaches, focal weakness or numbness. No saddle paresthesias or loss of bowel or bladder control 10-point ROS otherwise negative.  ____________________________________________   PHYSICAL EXAM:  VITAL SIGNS: ED Triage Vitals  Enc Vitals Group     BP 04/18/16 1622 133/73     Pulse Rate 04/18/16 1622 68     Resp 04/18/16 1622 15      Temp 04/18/16 1622 98.8 F (37.1 C)     Temp Source 04/18/16 1622 Oral     SpO2 04/18/16 1622 100 %     Weight 04/18/16 1618 (!) 304 lb (137.9 kg)     Height 04/18/16 1618 5\' 8"  (1.727 m)     Head Circumference --      Peak Flow --      Pain Score 04/18/16 1618 10     Pain Loc --      Pain Edu? --      Excl. in [place]? --      Constitutional: Alert and oriented. Well appearing and in no acute distress. Patient is playing on her phone throughout the encounter. Eyes: Conjunctivae are normal. Head: Atraumatic. Cardiovascular:  Good peripheral circulation. Respiratory: Normal respiratory effort without tachypnea or retractions.  Musculoskeletal: No midline spinal tenderness about the thoracic or lumbar areas. Bilateral paraspinal tenderness about the lower thoracic and diffuse lumbar regions. No SI joint tenderness. Neurologic:  Normal speech and language. Normal gait. No gross focal neurologic deficits are appreciated.  Skin:  Skin is warm, dry and intact. No rash, redness, skin sores noted. Psychiatric: Mood and affect are normal. Speech and behavior are normal. Patient exhibits appropriate insight and judgement.   ____________________________________________   LABS  None ____________________________________________  EKG  None ____________________________________________  RADIOLOGY  None ____________________________________________    PROCEDURES  Procedure(s) performed: None   Procedures   Medications  ketorolac (TORADOL) 30 MG/ML injection 30 mg (30 mg Intramuscular Refused 04/18/16 1702)     ____________________________________________   INITIAL IMPRESSION / ASSESSMENT AND PLAN / ED COURSE  Pertinent labs & imaging results that were available during my care of the patient were reviewed by me and considered in my medical decision making (see chart for details).  Clinical Course as of Apr 18 1712  Fri Apr 18, 2016  [place] controlled  substance database was queried over the last 12 months. Patient has been receiving Percocet No. 60 tablets monthly since November 2016 from Dr. Clide Deutscher with the last prescription given 02/06/2016 for #60 Percocet 7.5 mg tablets.  [id number] Patient states she has been on multiple muscle relaxers that do not help and is currently on Tiazac tizanidine. Also notes she has been on prednisone in the past which again did not help with her back pain. She was referred to a spine specialist in which she saw yesterday and had an MRI completed. She was not given any pain medicines at that time and states that is her reasoning for coming to the emergency department. Discussed with patient that she would need to continue follow up with her spine specialist and follow their recommendations. She is agreeable to have a Toradol injection while in the emergency department.   [id number] Patient refused IM Toradol when the nurse attempted to administer stating she did not want an injection. She is amenable to try the oral form of Toradol outpatient. Patient will be discharged home to follow-up with  her spinal specialist.  [JH]    Clinical Course User Index [JH] Bettylee Feig L Decie Verne, PA-C    Patient's diagnosis is consistent with Chronic bilateral low back pain with right-sided sciatica. Patient will be discharged home with prescriptions for ketorolac to use as directed. Patient was advised not to take any other NSAIDs while on this medication and may take Tylenol as needed. Patient is to follow up with her primary care provider or spinal specialist if symptoms persist past this treatment course. Patient is given ED precautions to return to the ED for any worsening or new symptoms.    ____________________________________________  FINAL CLINICAL IMPRESSION(S) / ED DIAGNOSES  Final diagnoses:  Chronic bilateral low back pain with right-sided sciatica      NEW MEDICATIONS STARTED DURING THIS VISIT:  New Prescriptions    KETOROLAC (TORADOL) 10 MG TABLET    Take 1 tablet (10 mg total) by mouth every 6 (six) hours as needed for severe pain. Please take with food and may not take for longer than 5 days. Do not take with any other NSAIDS. May take Tylenol only while on this medication.         Braxton Feathers, PA-C 04/18/16 1714    Lisa Roca, MD 04/18/16 1739

## 2016-04-18 NOTE — ED Notes (Signed)
Pt refused IM toradol , states " I hate shots, I will just stay in pain then". Provider notified.

## 2016-04-18 NOTE — ED Triage Notes (Addendum)
Pt to ed with c/o back pain, right hip pain and right leg pain x several months.  Pt denies injury. Pt ambulates with ease, reports hx of back pain.  When asked about pain, pt laughed and states "it is past a 10".

## 2016-04-18 NOTE — ED Notes (Signed)
Patient here for pain management. Chronic back pain.

## 2016-05-13 ENCOUNTER — Emergency Department: Payer: Medicaid Other

## 2016-05-13 ENCOUNTER — Encounter: Payer: Self-pay | Admitting: Emergency Medicine

## 2016-05-13 ENCOUNTER — Emergency Department
Admission: EM | Admit: 2016-05-13 | Discharge: 2016-05-13 | Disposition: A | Discharge status: Home / Self Care | Payer: Medicaid Other | Attending: Emergency Medicine | Admitting: Emergency Medicine

## 2016-05-13 DIAGNOSIS — Y939 Activity, unspecified: Secondary | ICD-10-CM | POA: Insufficient documentation

## 2016-05-13 DIAGNOSIS — Z79899 Other long term (current) drug therapy: Secondary | ICD-10-CM | POA: Diagnosis not present

## 2016-05-13 DIAGNOSIS — X58XXXA Exposure to other specified factors, initial encounter: Secondary | ICD-10-CM | POA: Insufficient documentation

## 2016-05-13 DIAGNOSIS — Y929 Unspecified place or not applicable: Secondary | ICD-10-CM | POA: Diagnosis not present

## 2016-05-13 DIAGNOSIS — Y999 Unspecified external cause status: Secondary | ICD-10-CM | POA: Diagnosis not present

## 2016-05-13 DIAGNOSIS — S99922A Unspecified injury of left foot, initial encounter: Secondary | ICD-10-CM | POA: Diagnosis present

## 2016-05-13 DIAGNOSIS — S92402A Displaced unspecified fracture of left great toe, initial encounter for closed fracture: Secondary | ICD-10-CM

## 2016-05-13 DIAGNOSIS — E039 Hypothyroidism, unspecified: Secondary | ICD-10-CM | POA: Insufficient documentation

## 2016-05-13 DIAGNOSIS — S92412A Displaced fracture of proximal phalanx of left great toe, initial encounter for closed fracture: Secondary | ICD-10-CM | POA: Diagnosis not present

## 2016-05-13 MED ORDER — NAPROXEN 500 MG PO TABS
500.0000 mg | ORAL_TABLET | Freq: Once | ORAL | Status: AC
  Administered 2016-05-13: 500 mg via ORAL
  Filled 2016-05-13: qty 1

## 2016-05-13 MED ORDER — NAPROXEN 500 MG PO TABS: 500.0000 mg | ORAL_TABLET | Freq: Two times a day (BID) | ORAL | Status: DC

## 2016-05-13 NOTE — ED Provider Notes (Signed)
[facility] Emergency Department Provider Note   ____________________________________________   First MD Initiated Contact with Patient 05/13/16 0801     (approximate)  I have reviewed the triage vital signs and the nursing notes.   HISTORY  Chief Complaint Toe Pain    HPI Christine Anderson is a 30 y.o. female patient complaining of pain, edema, and a bruise to the first digit left foot. Patient states she stubbed her toe yesterday. Patient rates the pain as a 8/10. Patient describes the pain as "sharp". Patient stated pain increases with ambulation. No palliative measures for complaint.   Past Medical History:  Diagnosis Date  . Chronic back pain   . Migraines     There are no active problems to display for this patient.   History reviewed. No pertinent surgical history.  Prior to Admission medications   Medication Sig Start Date End Date Taking? Authorizing Provider  cephALEXin (KEFLEX) 500 MG capsule Take 1 capsule (500 mg total) by mouth 4 (four) times daily. 02/21/15   Hinda Kehr, MD  doxycycline (VIBRAMYCIN) 100 MG capsule Take 1 capsule (100 mg) by mouth twice daily for 10 days. 02/21/15   Hinda Kehr, MD  gabapentin (NEURONTIN) 400 MG capsule Take 400 mg by mouth 2 (two) times daily.    Historical Provider, MD  ibuprofen (ADVIL,MOTRIN) 800 MG tablet Take 1 tablet (800 mg total) by mouth every 8 (eight) hours as needed. 08/30/15   Mortimer Fries, PA-C  levothyroxine (SYNTHROID, LEVOTHROID) 100 MCG tablet Take 100 mcg by mouth daily before breakfast.    Historical Provider, MD  meloxicam (MOBIC) 15 MG tablet Take 15 mg by mouth daily.    Historical Provider, MD  naproxen (NAPROSYN) 500 MG tablet Take 1 tablet (500 mg total) by mouth 2 (two) times daily with a meal. 05/13/16   Sable Feil, PA-C  ondansetron (ZOFRAN) 4 MG tablet Take 1 tablet (4 mg total) by mouth daily as needed for nausea or vomiting. 08/30/15 08/29/16  Mortimer Fries, PA-C    oxyCODONE-acetaminophen (PERCOCET) 7.5-325 MG tablet Take 1 tablet by mouth every 4 (four) hours as needed for severe pain.    Historical Provider, MD  sertraline (ZOLOFT) 25 MG tablet Take 25 mg by mouth daily.    Historical Provider, MD  SUMAtriptan (IMITREX) 100 MG tablet Take 100 mg by mouth every 2 (two) hours as needed for migraine. May repeat in 2 hours if headache persists or recurs.    Historical Provider, MD    Allergies Hydrocodone; Flexeril [cyclobenzaprine]; Septra [sulfamethoxazole-trimethoprim]; and Tramadol  History reviewed. No pertinent family history.  Social History Social History  Substance Use Topics  . Smoking status: Never Smoker  . Smokeless tobacco: Never Used  . Alcohol use No    Review of Systems Constitutional: No fever/chills Eyes: No visual changes. ENT: No sore throat. Cardiovascular: Denies chest pain. Respiratory: Denies shortness of breath. Gastrointestinal: No abdominal pain.  No nausea, no vomiting.  No diarrhea.  No constipation. Genitourinary: Negative for dysuria. Musculoskeletal: Left great toe pain Skin: Negative for rash. Neurological: Negative for headaches, focal weakness or numbness. Endocrine:Hypothyroidism Allergic/Immunilogical: See medication list ________________________________________   PHYSICAL EXAM:  VITAL SIGNS: ED Triage Vitals [05/13/16 0745]  Enc Vitals Group     BP      Pulse      Resp      Temp      Temp src      SpO2      Weight Marland Kitchen)  304 lb (137.9 kg)     Height      Head Circumference      Peak Flow      Pain Score 8     Pain Loc      Pain Edu?      Excl. in [place]?     Constitutional: Alert and oriented. Well appearing and in no acute distress. Eyes: Conjunctivae are normal. PERRL. EOMI. Head: Atraumatic. Nose: No congestion/rhinnorhea. Mouth/Throat: Mucous membranes are moist.  Oropharynx non-erythematous. Neck: No stridor.  No cervical spine tenderness to  palpation. Hematological/Lymphatic/Immunilogical: No cervical lymphadenopathy. Cardiovascular: Normal rate, regular rhythm. Grossly normal heart sounds.  Good peripheral circulation. Respiratory: Normal respiratory effort.  No retractions. Lungs CTAB. Gastrointestinal: Soft and nontender. No distention. No abdominal bruits. No CVA tenderness. Musculoskeletal: No obvious deformity to the left great toe. Patient has edema and moderate guarding palpation.  Neurologic:  Normal speech and language. No gross focal neurologic deficits are appreciated. No gait instability. Skin:  Skin is warm, dry and intact. No rash noted. Ecchymosis to the medial aspect of the left great toe Psychiatric: Mood and affect are normal. Speech and behavior are normal.  ____________________________________________   LABS (all labs ordered are listed, but only abnormal results are displayed)  Labs Reviewed - No data to display ____________________________________________  EKG   ____________________________________________  RADIOLOGY  X-rays show a fracture to the base of the proximal phalange first digit left foot. ____________________________________________   PROCEDURES  Procedure(s) performed: None  Procedures  Critical Care performed: No  ____________________________________________   INITIAL IMPRESSION / ASSESSMENT AND PLAN / ED COURSE  Pertinent labs & imaging results that were available during my care of the patient were reviewed by me and considered in my medical decision making (see chart for details).  Fractured left great toe. Patient given discharge care instructions. Prescription for naproxen. Patient given a work note for 2 days.  Clinical Course   Discussed x-ray finding with patient. Patient toe was buddy taped. Patient placed an open shoe. Patient advised follow-up with family doctor as needed.   ____________________________________________   FINAL CLINICAL IMPRESSION(S) / ED  DIAGNOSES  Final diagnoses:  Closed displaced fracture of phalanx of left great toe, unspecified phalanx, initial encounter      NEW MEDICATIONS STARTED DURING THIS VISIT:  New Prescriptions   NAPROXEN (NAPROSYN) 500 MG TABLET    Take 1 tablet (500 mg total) by mouth 2 (two) times daily with a meal.     Note:  This document was prepared using Dragon voice recognition software and may include unintentional dictation errors.    Sable Feil, PA-C 05/13/16 [id number]    Orbie Pyo, MD 05/14/16 1356

## 2016-05-13 NOTE — Discharge Instructions (Signed)
Keep toe buddy taped and wear open shoe as directed. Advised follow-up x-ray 11.

## 2016-05-13 NOTE — ED Triage Notes (Signed)
Pt to ed with c/o left foot 1st toe pain.  Pt states she thinks she may have hit it yesterday.  Pt limping at triage.

## 2016-05-13 NOTE — ED Notes (Signed)
See triage note  States she caught her foot yesterday    Having pain to left foot  bruising and swelling noted to lateral aspect of foot  Positive pulses

## 2016-08-31 ENCOUNTER — Other Ambulatory Visit: Payer: Self-pay | Admitting: Specialist

## 2016-09-03 ENCOUNTER — Encounter: Payer: Self-pay | Admitting: *Deleted

## 2016-09-03 ENCOUNTER — Encounter
Admission: RE | Admit: 2016-09-03 | Discharge: 2016-09-03 | Disposition: A | Discharge status: Home / Self Care | Payer: No Typology Code available for payment source | Source: Ambulatory Visit | Attending: Specialist | Admitting: Specialist

## 2016-09-03 NOTE — Patient Instructions (Addendum)
  Your procedure is scheduled on: 09-08-16 Report to Same Day Surgery 2nd floor medical mall [facility] Entrance-take elevator on left to 2nd floor.  Check in with surgery information desk.) To find out your arrival time please call [phone] between 1PM - 3PM on 09-05-16  Remember: Instructions that are not followed completely may result in serious medical risk, up to and including death, or upon the discretion of your surgeon and anesthesiologist your surgery may need to be rescheduled.    _x___ 1. Do not eat food or drink liquids after midnight. No gum chewing or  hard candies.     __x__ 2. No Alcohol for 24 hours before or after surgery.   __x__3. No Smoking for 24 prior to surgery.   ____  4. Bring all medications with you on the day of surgery if instructed.    __x__ 5. Notify your doctor if there is any change in your medical condition     (cold, fever, infections).     Do not wear jewelry, make-up, hairpins, clips or nail polish.  Do not wear lotions, powders, or perfumes. You may wear deodorant.  Do not shave 48 hours prior to surgery. Men may shave face and neck.  Do not bring valuables to the hospital.    [facility] is not responsible for any belongings or valuables.               Contacts, dentures or bridgework may not be worn into surgery.  Leave your suitcase in the car. After surgery it may be brought to your room.  For patients admitted to the hospital, discharge time is determined by your treatment team.   Patients discharged the day of surgery will not be allowed to drive home.  You will need someone to drive you home and stay with you the night of your procedure.    Please read over the following fact sheets that you were given:   [facility] Preparing for Surgery and or MRSA Information   _x___ Take anti-hypertensive (unless it includes a diuretic), cardiac, seizure, asthma,     anti-reflux and psychiatric medicines. These include:  1. LEVOTHYROXINE  2.  SERTRALINE  3. GABAPENTIN  4.  5.  6.  ____Fleets enema or Magnesium Citrate as directed.   ____ Use CHG Soap or sage wipes as directed on instruction sheet   ____ Use inhalers on the day of surgery and bring to hospital day of surgery  ____ Stop Metformin and Janumet 2 days prior to surgery.    ____ Take 1/2 of usual insulin dose the night before surgery and none on the morning     surgery.   ____ Follow recommendations from Cardiologist, Pulmonologist or PCP regarding          stopping Aspirin, Coumadin, Pllavix ,Eliquis, Effient, or Pradaxa, and Pletal.  X____Stop Anti-inflammatories such as Advil, Aleve, Ibuprofen, Motrin, Naproxen, MELOXICAM, Naprosyn, Goodies powders or aspirin products NOW-OK to take Tylenol    ____ Stop supplements until after surgery.     ____ Bring C-Pap to the hospital.

## 2016-09-08 ENCOUNTER — Ambulatory Visit: Payer: Medicaid Other | Admitting: Anesthesiology

## 2016-09-08 ENCOUNTER — Encounter
Admission: RE | Disposition: A | Discharge status: Home / Self Care | Payer: Self-pay | Source: Ambulatory Visit | Attending: Specialist

## 2016-09-08 ENCOUNTER — Ambulatory Visit
Admission: RE | Admit: 2016-09-08 | Discharge: 2016-09-08 | Disposition: A | Discharge status: Home / Self Care | Payer: Medicaid Other | Source: Ambulatory Visit | Attending: Specialist | Admitting: Specialist

## 2016-09-08 ENCOUNTER — Encounter: Payer: Self-pay | Admitting: Anesthesiology

## 2016-09-08 DIAGNOSIS — E039 Hypothyroidism, unspecified: Secondary | ICD-10-CM | POA: Insufficient documentation

## 2016-09-08 DIAGNOSIS — E669 Obesity, unspecified: Secondary | ICD-10-CM | POA: Diagnosis not present

## 2016-09-08 DIAGNOSIS — Y831 Surgical operation with implant of artificial internal device as the cause of abnormal reaction of the patient, or of later complication, without mention of misadventure at the time of the procedure: Secondary | ICD-10-CM | POA: Insufficient documentation

## 2016-09-08 DIAGNOSIS — Z79899 Other long term (current) drug therapy: Secondary | ICD-10-CM | POA: Diagnosis not present

## 2016-09-08 DIAGNOSIS — T8484XA Pain due to internal orthopedic prosthetic devices, implants and grafts, initial encounter: Secondary | ICD-10-CM | POA: Diagnosis not present

## 2016-09-08 DIAGNOSIS — Z791 Long term (current) use of non-steroidal anti-inflammatories (NSAID): Secondary | ICD-10-CM | POA: Diagnosis not present

## 2016-09-08 DIAGNOSIS — F419 Anxiety disorder, unspecified: Secondary | ICD-10-CM | POA: Insufficient documentation

## 2016-09-08 HISTORY — DX: Anxiety disorder, unspecified: F41.9

## 2016-09-08 HISTORY — DX: Hypothyroidism, unspecified: E03.9

## 2016-09-08 HISTORY — DX: Anemia, unspecified: D64.9

## 2016-09-08 HISTORY — PX: HARDWARE REMOVAL: SHX979

## 2016-09-08 LAB — POCT PREGNANCY, URINE: PREG TEST UR: NEGATIVE

## 2016-09-08 LAB — HEMOGLOBIN: HEMOGLOBIN: 11.5 g/dL — AB (ref 12.0–16.0)

## 2016-09-08 SURGERY — REMOVAL, HARDWARE
Anesthesia: General | Laterality: Right

## 2016-09-08 MED ORDER — MELOXICAM 7.5 MG PO TABS
15.0000 mg | ORAL_TABLET | Freq: Once | ORAL | Status: AC
  Administered 2016-09-08: 15 mg via ORAL

## 2016-09-08 MED ORDER — LIDOCAINE HCL (CARDIAC) 20 MG/ML IV SOLN
INTRAVENOUS | Status: DC | PRN
  Administered 2016-09-08: 40 mg via INTRAVENOUS

## 2016-09-08 MED ORDER — ONDANSETRON HCL 4 MG/2ML IJ SOLN
INTRAMUSCULAR | Status: DC | PRN
  Administered 2016-09-08: 4 mg via INTRAVENOUS

## 2016-09-08 MED ORDER — GABAPENTIN 400 MG PO CAPS
400.0000 mg | ORAL_CAPSULE | Freq: Once | ORAL | Status: AC
  Administered 2016-09-08: 400 mg via ORAL

## 2016-09-08 MED ORDER — SUGAMMADEX SODIUM 200 MG/2ML IV SOLN
INTRAVENOUS | Status: DC | PRN
  Administered 2016-09-08: 275.8 mg via INTRAVENOUS

## 2016-09-08 MED ORDER — MIDAZOLAM HCL 2 MG/2ML IJ SOLN
INTRAMUSCULAR | Status: AC
  Filled 2016-09-08: qty 2

## 2016-09-08 MED ORDER — CEFAZOLIN SODIUM-DEXTROSE 2-4 GM/100ML-% IV SOLN
INTRAVENOUS | Status: AC
  Filled 2016-09-08: qty 100

## 2016-09-08 MED ORDER — ACETAMINOPHEN 10 MG/ML IV SOLN
INTRAVENOUS | Status: AC
  Filled 2016-09-08: qty 100

## 2016-09-08 MED ORDER — NEOMYCIN-POLYMYXIN B GU 40-200000 IR SOLN
Status: DC | PRN
  Administered 2016-09-08: 2 mL

## 2016-09-08 MED ORDER — FENTANYL CITRATE (PF) 100 MCG/2ML IJ SOLN
25.0000 ug | INTRAMUSCULAR | Status: AC | PRN
  Administered 2016-09-08 (×6): 25 ug via INTRAVENOUS

## 2016-09-08 MED ORDER — PROPOFOL 10 MG/ML IV BOLUS
INTRAVENOUS | Status: DC | PRN
  Administered 2016-09-08: 180 mg via INTRAVENOUS
  Administered 2016-09-08: 20 mg via INTRAVENOUS

## 2016-09-08 MED ORDER — FAMOTIDINE 20 MG PO TABS
20.0000 mg | ORAL_TABLET | Freq: Once | ORAL | Status: AC
  Administered 2016-09-08: 20 mg via ORAL

## 2016-09-08 MED ORDER — MIDAZOLAM HCL 2 MG/2ML IJ SOLN
INTRAMUSCULAR | Status: DC | PRN
  Administered 2016-09-08: .5 mg via INTRAVENOUS
  Administered 2016-09-08: 1 mg via INTRAVENOUS
  Administered 2016-09-08: .5 mg via INTRAVENOUS

## 2016-09-08 MED ORDER — SEVOFLURANE IN SOLN
RESPIRATORY_TRACT | Status: AC
  Filled 2016-09-08: qty 250

## 2016-09-08 MED ORDER — CHLORHEXIDINE GLUCONATE CLOTH 2 % EX PADS
6.0000 | MEDICATED_PAD | Freq: Once | CUTANEOUS | Status: AC
Start: 2016-09-08 — End: 2016-09-08
  Administered 2016-09-08: 6 via TOPICAL

## 2016-09-08 MED ORDER — FENTANYL CITRATE (PF) 100 MCG/2ML IJ SOLN
INTRAMUSCULAR | Status: AC
  Administered 2016-09-08: 25 ug via INTRAVENOUS
  Filled 2016-09-08: qty 2

## 2016-09-08 MED ORDER — ROCURONIUM BROMIDE 100 MG/10ML IV SOLN
INTRAVENOUS | Status: DC | PRN
  Administered 2016-09-08: 10 mg via INTRAVENOUS
  Administered 2016-09-08: 20 mg via INTRAVENOUS

## 2016-09-08 MED ORDER — FAMOTIDINE 20 MG PO TABS
ORAL_TABLET | ORAL | Status: AC
  Filled 2016-09-08: qty 1

## 2016-09-08 MED ORDER — MELOXICAM 7.5 MG PO TABS
ORAL_TABLET | ORAL | Status: AC
  Administered 2016-09-08: 15 mg via ORAL
  Filled 2016-09-08: qty 2

## 2016-09-08 MED ORDER — LIDOCAINE HCL (PF) 2 % IJ SOLN
INTRAMUSCULAR | Status: AC
  Filled 2016-09-08: qty 2

## 2016-09-08 MED ORDER — PROPOFOL 10 MG/ML IV BOLUS
INTRAVENOUS | Status: AC
  Filled 2016-09-08: qty 20

## 2016-09-08 MED ORDER — ONDANSETRON HCL 4 MG/2ML IJ SOLN: 4.0000 mg | Freq: Once | INTRAMUSCULAR | Status: DC | PRN

## 2016-09-08 MED ORDER — CHLORHEXIDINE GLUCONATE CLOTH 2 % EX PADS: 6.0000 | MEDICATED_PAD | Freq: Once | CUTANEOUS | Status: DC

## 2016-09-08 MED ORDER — FENTANYL CITRATE (PF) 100 MCG/2ML IJ SOLN
INTRAMUSCULAR | Status: AC
  Filled 2016-09-08: qty 2

## 2016-09-08 MED ORDER — ACETAMINOPHEN 10 MG/ML IV SOLN
INTRAVENOUS | Status: DC | PRN
  Administered 2016-09-08: 1000 mg via INTRAVENOUS

## 2016-09-08 MED ORDER — DEXAMETHASONE SODIUM PHOSPHATE 10 MG/ML IJ SOLN: INTRAMUSCULAR | Status: DC | PRN

## 2016-09-08 MED ORDER — CEFAZOLIN SODIUM-DEXTROSE 2-4 GM/100ML-% IV SOLN
2.0000 g | INTRAVENOUS | Status: AC
  Administered 2016-09-08: 2 g via INTRAVENOUS

## 2016-09-08 MED ORDER — GLYCOPYRROLATE 0.2 MG/ML IJ SOLN
INTRAMUSCULAR | Status: DC | PRN
  Administered 2016-09-08: .2 mg via INTRAVENOUS

## 2016-09-08 MED ORDER — LACTATED RINGERS IV SOLN
INTRAVENOUS | Status: DC
  Administered 2016-09-08: 16:00:00 via INTRAVENOUS

## 2016-09-08 MED ORDER — GABAPENTIN 400 MG PO CAPS
ORAL_CAPSULE | ORAL | Status: AC
  Administered 2016-09-08: 400 mg via ORAL
  Filled 2016-09-08: qty 1

## 2016-09-08 MED ORDER — ACETAMINOPHEN-CODEINE 300-30 MG PO TABS: 1.0000 | ORAL_TABLET | Freq: Four times a day (QID) | ORAL | 0 refills | Status: DC | PRN

## 2016-09-08 MED ORDER — CLINDAMYCIN PHOSPHATE 600 MG/50ML IV SOLN
INTRAVENOUS | Status: AC
  Filled 2016-09-08: qty 50

## 2016-09-08 MED ORDER — KETOROLAC TROMETHAMINE 30 MG/ML IJ SOLN
INTRAMUSCULAR | Status: DC | PRN
  Administered 2016-09-08: 30 mg via INTRAVENOUS

## 2016-09-08 MED ORDER — DEXAMETHASONE SODIUM PHOSPHATE 10 MG/ML IJ SOLN
INTRAMUSCULAR | Status: DC | PRN
  Administered 2016-09-08: 10 mg via INTRAVENOUS

## 2016-09-08 MED ORDER — DEXAMETHASONE SODIUM PHOSPHATE 10 MG/ML IJ SOLN
INTRAMUSCULAR | Status: AC
  Filled 2016-09-08: qty 1

## 2016-09-08 MED ORDER — CLINDAMYCIN PHOSPHATE 600 MG/50ML IV SOLN
600.0000 mg | Freq: Once | INTRAVENOUS | Status: AC
  Administered 2016-09-08: 600 mg via INTRAVENOUS

## 2016-09-08 MED ORDER — BUPIVACAINE HCL (PF) 0.5 % IJ SOLN
INTRAMUSCULAR | Status: DC | PRN
  Administered 2016-09-08: 30 mL

## 2016-09-08 MED ORDER — FENTANYL CITRATE (PF) 100 MCG/2ML IJ SOLN
INTRAMUSCULAR | Status: DC | PRN
  Administered 2016-09-08 (×2): 50 ug via INTRAVENOUS

## 2016-09-08 SURGICAL SUPPLY — 29 items
BNDG ESMARK 4X12 TAN STRL LF (GAUZE/BANDAGES/DRESSINGS) ×3 IMPLANT
CANISTER SUCT 1200ML W/VALVE (MISCELLANEOUS) ×3 IMPLANT
CHLORAPREP W/TINT 26ML (MISCELLANEOUS) ×6 IMPLANT
CUFF TOURN 18 STER (MISCELLANEOUS) IMPLANT
CUFF TOURN 24 STER (MISCELLANEOUS) ×2 IMPLANT
DRAPE FLUOR MINI C-ARM 54X84 (DRAPES) ×3 IMPLANT
ELECT REM PT RETURN 9FT ADLT (ELECTROSURGICAL) ×3
ELECTRODE REM PT RTRN 9FT ADLT (ELECTROSURGICAL) ×1 IMPLANT
GAUZE PETRO XEROFOAM 1X8 (MISCELLANEOUS) ×3 IMPLANT
GAUZE SPONGE 4X4 12PLY STRL (GAUZE/BANDAGES/DRESSINGS) ×3 IMPLANT
GLOVE SURG ORTHO 8.0 STRL STRW (GLOVE) ×3 IMPLANT
GOWN STRL REUS W/ TWL XL LVL3 (GOWN DISPOSABLE) ×1 IMPLANT
GOWN STRL REUS W/TWL LRG LVL4 (GOWN DISPOSABLE) ×3 IMPLANT
GOWN STRL REUS W/TWL XL LVL3 (GOWN DISPOSABLE) ×3
KIT RM TURNOVER STRD PROC AR (KITS) ×3 IMPLANT
NS IRRIG 500ML POUR BTL (IV SOLUTION) ×3 IMPLANT
PACK EXTREMITY ARMC (MISCELLANEOUS) ×3 IMPLANT
PADDING CAST 4IN STRL (MISCELLANEOUS) ×4
PADDING CAST BLEND 4X4 STRL (MISCELLANEOUS) ×2 IMPLANT
SOL PREP PVP 2OZ (MISCELLANEOUS) ×3
SOLUTION PREP PVP 2OZ (MISCELLANEOUS) ×1 IMPLANT
STOCKINETTE BIAS CUT 4 980044 (GAUZE/BANDAGES/DRESSINGS) ×1 IMPLANT
STOCKINETTE BIAS CUT 6 980064 (GAUZE/BANDAGES/DRESSINGS) ×3 IMPLANT
STOCKINETTE STRL 6IN 960660 (GAUZE/BANDAGES/DRESSINGS) ×3 IMPLANT
SUT ETHILON 4-0 (SUTURE) ×3
SUT ETHILON 4-0 FS2 18XMFL BLK (SUTURE) ×1
SUT VIC AB 3-0 SH 27 (SUTURE) ×3
SUT VIC AB 3-0 SH 27X BRD (SUTURE) ×1 IMPLANT
SUTURE ETHLN 4-0 FS2 18XMF BLK (SUTURE) ×1 IMPLANT

## 2016-09-08 NOTE — Transfer of Care (Signed)
Immediate Anesthesia Transfer of Care Note  Patient: Christine Anderson  Procedure(s) Performed: Procedure(s) with comments: HARDWARE REMOVAL (Right) - Right elbow  Patient Location: PACU  Anesthesia Type:General  Level of Consciousness: sedated  Airway & Oxygen Therapy: Patient connected to face mask oxygen  Post-op Assessment: Post -op Vital signs reviewed and stable  Post vital signs: stable  Last Vitals:  Vitals:   09/08/16 1431 09/08/16 1710  BP: 128/74 (!) 110/54  Pulse: 77 87  Resp: 18 18  Temp: 36.8 C 36.2 C    Last Pain:  Vitals:   09/08/16 1710  TempSrc: Temporal  PainSc:          Complications: No apparent anesthesia complications

## 2016-09-08 NOTE — OR Nursing (Signed)
Two pins and two k-wires explanted from patient's right elbow by Dr. Sabra Heck.

## 2016-09-08 NOTE — Anesthesia Post-op Follow-up Note (Cosign Needed)
Anesthesia QCDR form completed.        

## 2016-09-08 NOTE — Anesthesia Procedure Notes (Signed)
Procedure Name: Intubation Date/Time: 09/08/2016 3:50 PM Performed by: Allean Found Pre-anesthesia Checklist: Patient identified, Emergency Drugs available, Suction available, Patient being monitored and Timeout performed Patient Re-evaluated:Patient Re-evaluated prior to inductionOxygen Delivery Method: Circle system utilized Preoxygenation: Pre-oxygenation with 100% oxygen Intubation Type: IV induction, Cricoid Pressure applied and Rapid sequence Laryngoscope Size: Mac and 3 Grade View: Grade I Tube type: Oral Number of attempts: 1 Airway Equipment and Method: Stylet Placement Confirmation: ETT inserted through vocal cords under direct vision,  positive ETCO2 and breath sounds checked- equal and bilateral Secured at: 21 cm Tube secured with: Tape Dental Injury: Teeth and Oropharynx as per pre-operative assessment

## 2016-09-08 NOTE — Anesthesia Postprocedure Evaluation (Signed)
Anesthesia Post Note  Patient: Christine Anderson  Procedure(s) Performed: Procedure(s) (LRB): HARDWARE REMOVAL (Right)  Patient location during evaluation: PACU Anesthesia Type: General Level of consciousness: awake and alert and oriented Pain management: pain level controlled Vital Signs Assessment: post-procedure vital signs reviewed and stable Respiratory status: spontaneous breathing Cardiovascular status: blood pressure returned to baseline Anesthetic complications: no     Last Vitals:  Vitals:   09/08/16 1710 09/08/16 1720  BP: (!) 110/54   Pulse: 87 84  Resp: 18 18  Temp: 36.2 C     Last Pain:  Vitals:   09/08/16 1720  TempSrc:   PainSc: 8                  Marthella Osorno

## 2016-09-08 NOTE — H&P (Signed)
THE PATIENT WAS SEEN PRIOR TO SURGERY TODAY.  HISTORY, ALLERGIES, HOME MEDICATIONS AND OPERATIVE PROCEDURE WERE REVIEWED. RISKS AND BENEFITS OF SURGERY DISCUSSED WITH PATIENT AGAIN.  NO CHANGES FROM INITIAL HISTORY AND PHYSICAL NOTED.    

## 2016-09-08 NOTE — Op Note (Signed)
09/08/2016  4:57 PM  PATIENT:  Christine Anderson  31 y.o. female  PRE-OPERATIVE DIAGNOSIS: Painful hardware right elbow  POST-OPERATIVE DIAGNOSIS:  Same  PROCEDURE:  HARDWARE REMOVAL RIGHT ELBOW (1 WIRE, 2 K-WIRES)  SURGEON:  Jamea Robicheaux E Kasarah Sitts MD  ASST:    ANESTHESIA:   General  EBL:  NIL   TOURNIQUET TIME:  34 MIN  OPERATIVE FINDINGS:  Extensive scar tissue and bursitis  OPERATIVE PROCEDURE:  The patient was brought to the Operating room and underwent satisfactory endotracheal anesthesia in the supine position.The operative arm was prepped and draped sterilely and an Esmarch bandage applied.Tourniquet was inflated to 250 mmHg.  The previous scar was then excised in its entirety.  Dissection was carried out through subcutaneous tissue down to the K wires posteriorly.  Dissection was carried up onto the ulna, exposing the Medial and lateral wire knots.The knots were unwound and the wires removed.  The K wires were easily removed  And excess scar and bursal tissue was excised. Subcutaneous tissue was closed with 2-0 and 3-0 Vicryl suture.The skin was closed with staples.  Half percent Marcaine was instilled in the wound.   Dry sterile dressing was applied.Tourniquet was deflated with good of blood flow.Patient was awakened and taken to recovery room in good condition.  Park Breed, MD

## 2016-09-08 NOTE — Anesthesia Preprocedure Evaluation (Signed)
Anesthesia Evaluation  Patient identified by MRN, date of birth, ID band Patient awake    Reviewed: Allergy & Precautions, NPO status , Patient's Chart, lab work & pertinent test results, reviewed documented beta blocker date and time   Airway Mallampati: III  TM Distance: >3 FB     Dental  (+) Chipped   Pulmonary           Cardiovascular      Neuro/Psych  Headaches, Anxiety    GI/Hepatic   Endo/Other  Hypothyroidism   Renal/GU      Musculoskeletal   Abdominal   Peds  Hematology  (+) anemia ,   Anesthesia Other Findings Obese.  Reproductive/Obstetrics                             Anesthesia Physical Anesthesia Plan  ASA: II  Anesthesia Plan: General   Post-op Pain Management:    Induction: Intravenous  Airway Management Planned: LMA  Additional Equipment:   Intra-op Plan:   Post-operative Plan:   Informed Consent: I have reviewed the patients History and Physical, chart, labs and discussed the procedure including the risks, benefits and alternatives for the proposed anesthesia with the patient or authorized representative who has indicated his/her understanding and acceptance.     Plan Discussed with: CRNA  Anesthesia Plan Comments:         Anesthesia Quick Evaluation

## 2016-09-08 NOTE — Discharge Instructions (Signed)

## 2016-09-09 ENCOUNTER — Encounter: Payer: Self-pay | Admitting: Specialist

## 2016-11-25 ENCOUNTER — Other Ambulatory Visit: Payer: Self-pay | Admitting: Family Medicine

## 2016-11-26 ENCOUNTER — Other Ambulatory Visit: Payer: Self-pay | Admitting: Family Medicine

## 2016-11-26 DIAGNOSIS — N63 Unspecified lump in unspecified breast: Secondary | ICD-10-CM

## 2016-12-01 ENCOUNTER — Ambulatory Visit: Payer: Self-pay | Attending: Oncology

## 2016-12-01 VITALS — BP 144/87 | HR 64 | Temp 97.5°F | Ht 69.0 in | Wt 335.0 lb

## 2016-12-01 DIAGNOSIS — N63 Unspecified lump in unspecified breast: Secondary | ICD-10-CM

## 2016-12-01 NOTE — Progress Notes (Signed)
Subjective:     Patient ID: Christine Anderson, female   DOB: Aug 22, 1985, 31 y.o.   MRN: [id number]  HPI   Review of Systems     Objective:   Physical Exam  Pulmonary/Chest: Right breast exhibits mass. Right breast exhibits no inverted nipple, no nipple discharge, no skin change and no tenderness. Left breast exhibits no inverted nipple, no mass, no nipple discharge, no skin change and no tenderness. Breasts are symmetrical.         Assessment:     31 year old patient presents for [facility] clinic visit. Referred to [facility] by Dr. Clide Deutscher at [facility], for right breast mass  Patient screened, and meets [facility] eligibility.  Patient does not have insurance, Medicare or Medicaid.  Handout given on Affordable Care Act. Instructed patient on breast self-exam using teach back method. CBE reveals a .5 cm nodule 1 o'clock right breast.  Patient reports it has been there 3 months off and on.  Intermittent pain associated with nodule.  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 She has 5 children.    Plan:     Scheduled to return 12/03/16 at 9:40 for bilateral diagnostic mammogram, and ultrasound.

## 2016-12-03 ENCOUNTER — Ambulatory Visit
Admission: RE | Admit: 2016-12-03 | Discharge: 2016-12-03 | Disposition: A | Discharge status: Home / Self Care | Payer: Self-pay | Source: Ambulatory Visit | Attending: Oncology | Admitting: Oncology

## 2016-12-03 DIAGNOSIS — N63 Unspecified lump in unspecified breast: Secondary | ICD-10-CM

## 2016-12-07 ENCOUNTER — Other Ambulatory Visit: Payer: Self-pay

## 2016-12-07 DIAGNOSIS — N63 Unspecified lump in unspecified breast: Secondary | ICD-10-CM

## 2016-12-16 NOTE — Progress Notes (Signed)
Mammogram results Birads 3 with recommendation for six month follow-up right breast ultrasound.  Scheduled for ultrasound on 06/04/17 at 9:20. Mailed appointment info.  Copy to [facility].

## 2017-06-03 ENCOUNTER — Ambulatory Visit: Payer: Self-pay | Attending: Oncology

## 2017-06-04 ENCOUNTER — Inpatient Hospital Stay: Admission: RE | Admit: 2017-06-04 | Payer: Self-pay | Source: Ambulatory Visit

## 2017-06-12 ENCOUNTER — Telehealth: Payer: Self-pay | Admitting: *Deleted

## 2017-06-12 NOTE — Telephone Encounter (Signed)
Left patient a message to return my call.  She no-showed for her 6 month follow-up ultrasound.  I would like to get her rescheduled.

## 2017-07-09 ENCOUNTER — Encounter: Payer: Self-pay | Admitting: *Deleted

## 2017-07-09 NOTE — Progress Notes (Signed)
Tried to call patient but the voicemail is in [place].  She has been called twice and sent 2 letters from the breast center in regards to missing her 6 month follow-up ultrasound.

## 2017-08-21 IMAGING — DX DG FOOT COMPLETE 3+V*L*
3 series · 3 of 3 positions shown · non-contrast
Comparison: Left foot films of 10/15/2006

CLINICAL DATA: Seven pain in the left great toe, some bruising

EXAM:
LEFT FOOT - COMPLETE 3+ VIEW

[foot ap]
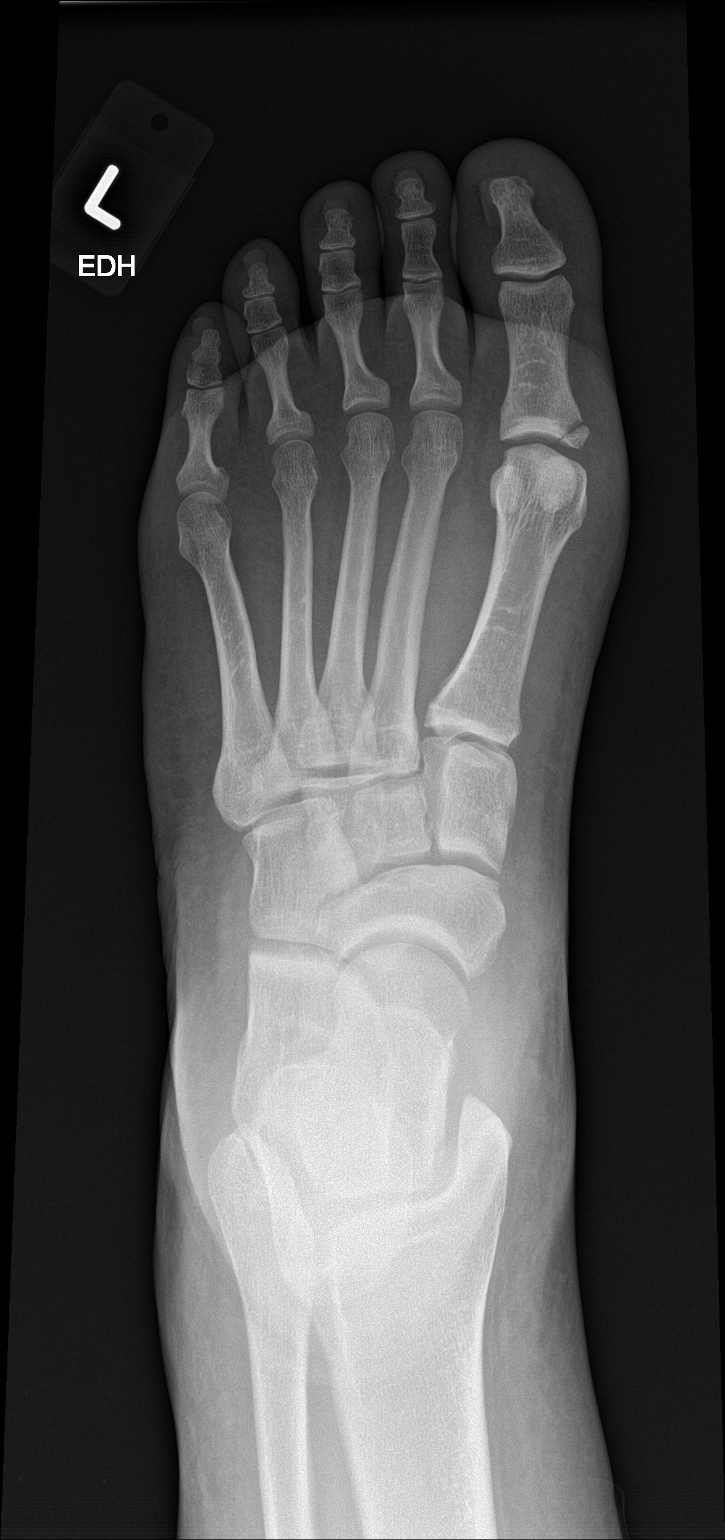

[foot obl]
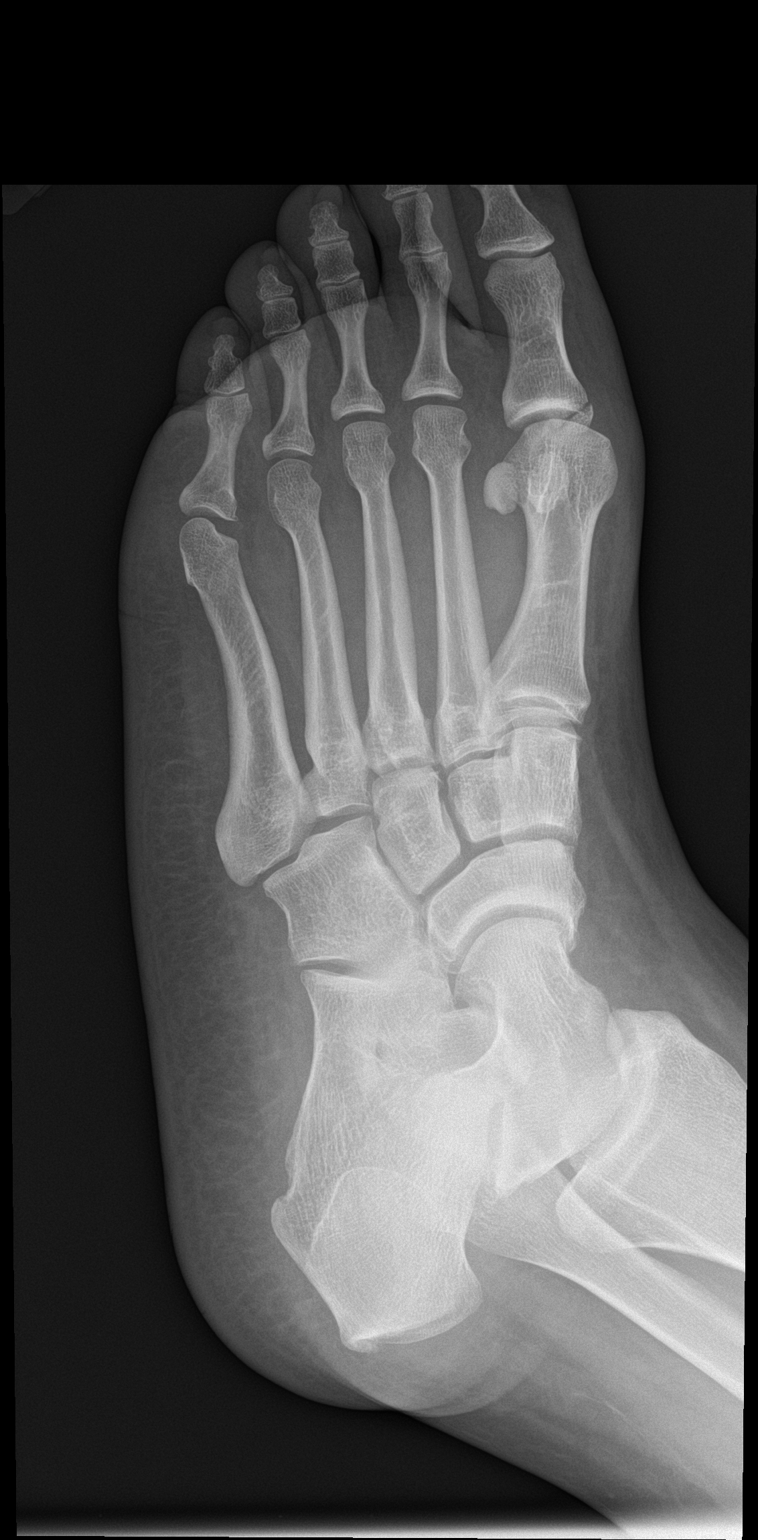

[foot lat]
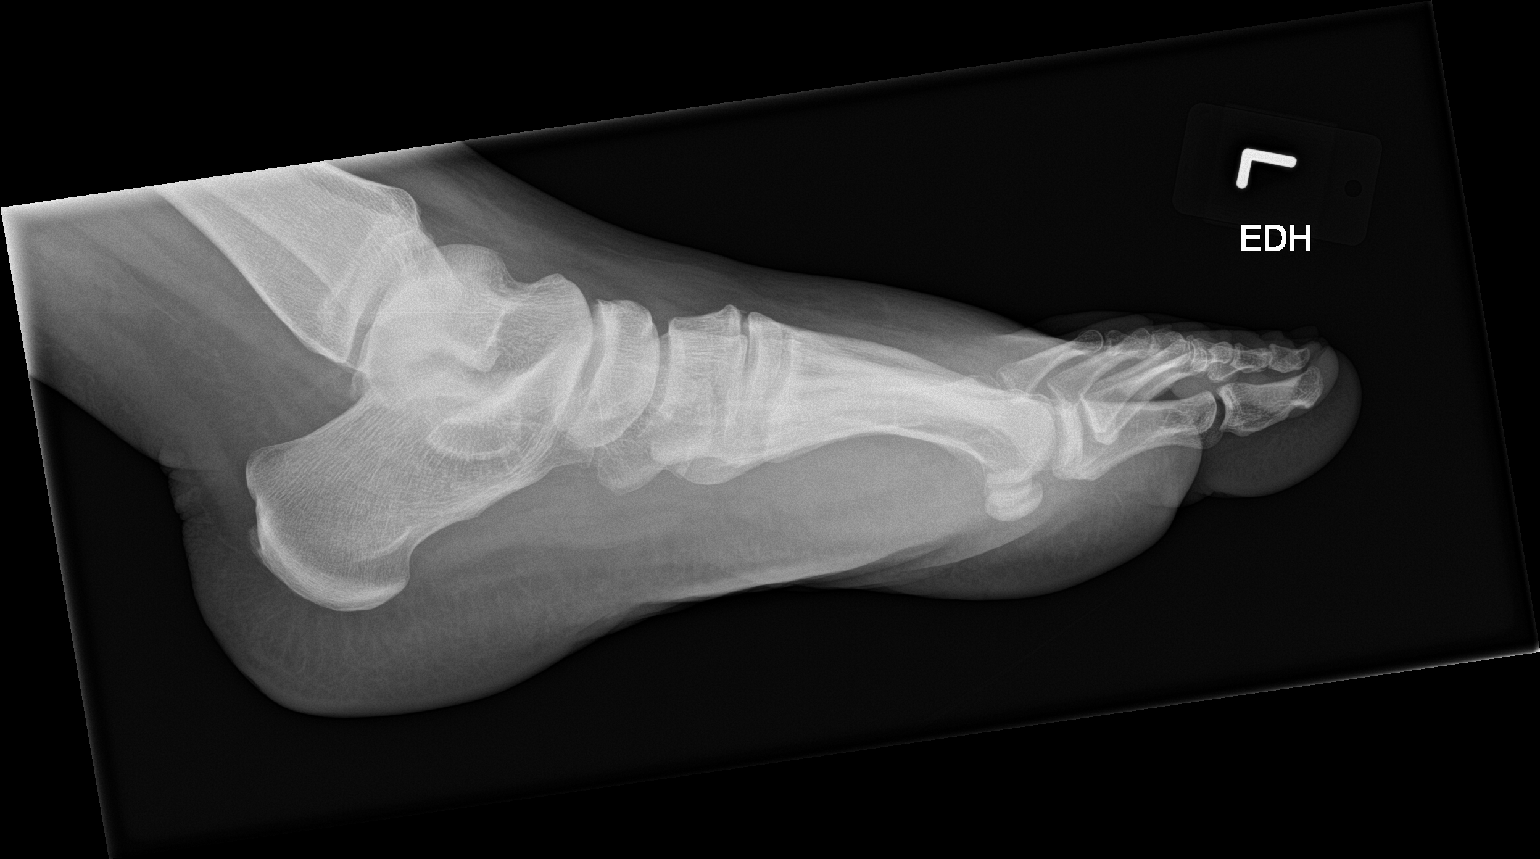

[3 of 3 positions shown; findings below may reference images not displayed]

FINDINGS: There is an acute minimally displaced fracture through the base of
the proximal phalanx of the left great toe. This fracture does
involve the left first MTP joint. Tarsal metatarsal alignment is
normal. No erosion is seen.
IMPRESSION: Minimally displaced fracture through the base of the proximal
phalanx of the left great toe.

## 2018-05-15 ENCOUNTER — Other Ambulatory Visit: Payer: Self-pay

## 2018-05-15 ENCOUNTER — Emergency Department: Payer: Medicaid Other

## 2018-05-15 ENCOUNTER — Encounter: Payer: Self-pay | Admitting: Emergency Medicine

## 2018-05-15 ENCOUNTER — Emergency Department
Admission: EM | Admit: 2018-05-15 | Discharge: 2018-05-15 | Disposition: A | Discharge status: Home / Self Care | Payer: Medicaid Other | Attending: Emergency Medicine | Admitting: Emergency Medicine

## 2018-05-15 DIAGNOSIS — M1711 Unilateral primary osteoarthritis, right knee: Secondary | ICD-10-CM | POA: Insufficient documentation

## 2018-05-15 DIAGNOSIS — Z79899 Other long term (current) drug therapy: Secondary | ICD-10-CM | POA: Insufficient documentation

## 2018-05-15 DIAGNOSIS — E039 Hypothyroidism, unspecified: Secondary | ICD-10-CM | POA: Diagnosis not present

## 2018-05-15 DIAGNOSIS — M25561 Pain in right knee: Secondary | ICD-10-CM | POA: Diagnosis present

## 2018-05-15 NOTE — ED Notes (Signed)
Ryheem Jay wrap applied in place of knee immobilizer per PA. PA aware of patient's BP at time of D/C. NAD noted at time of D/C. Pt denies questions or concerns. Pt ambulatory to the lobby at this time.

## 2018-05-15 NOTE — Discharge Instructions (Signed)
Follow up with orthopedics if not improving over the next week or so.  Return to the ER for symptoms that change or worsen if unable to schedule an appointment.

## 2018-05-15 NOTE — ED Triage Notes (Signed)
Felt pop while walking last evening R knee.

## 2018-05-15 NOTE — ED Provider Notes (Signed)
[facility] Emergency Department Provider Note ____________________________________________  Time seen: Approximately 10:27 AM  I have reviewed the triage vital signs and the nursing notes.   HISTORY  Chief Complaint Knee Pain    HPI DUDLEY COOLEY is a 32 y.o. female who presents to the emergency department for evaluation and treatment of right knee pain. While walking last night, she heard and felt a "pop." Pain with any attempt to move or bear weight. She is having to walk on her toes. No relief with ibuprofen.  Past Medical History:  Diagnosis Date  . Anemia   . Anxiety   . Chronic back pain   . Hypothyroidism   . Migraines     There are no active problems to display for this patient.   Past Surgical History:  Procedure Laterality Date  . CESAREAN SECTION     x3  . ELBOW FRACTURE SURGERY Right   . HARDWARE REMOVAL Right 09/08/2016   Procedure: HARDWARE REMOVAL;  Surgeon: Earnestine Leys, MD;  Location: [facility] ORS;  Service: Orthopedics;  Laterality: Right;  Right elbow    Prior to Admission medications   Medication Sig Start Date End Date Taking? Authorizing Provider  Acetaminophen-Codeine (TYLENOL/CODEINE #3) 300-30 MG tablet Take 1 tablet by mouth every 4 (four) hours as needed for pain.    [provider]  Acetaminophen-Codeine 300-30 MG tablet Take 1 tablet by mouth every 6 (six) hours as needed for pain. 09/08/16   Earnestine Leys, MD  gabapentin (NEURONTIN) 400 MG capsule Take 400 mg by mouth 2 (two) times daily.    [provider]  levothyroxine (SYNTHROID, LEVOTHROID) 100 MCG tablet Take 100 mcg by mouth daily before breakfast.    [provider]  meloxicam (MOBIC) 15 MG tablet Take 15 mg by mouth daily.    [provider]  sertraline (ZOLOFT) 25 MG tablet Take 25 mg by mouth every morning.     [provider]  SUMAtriptan (IMITREX) 100 MG tablet Take 100 mg by mouth every 2 (two) hours as needed  for migraine. May repeat in 2 hours if headache persists or recurs.    [provider]    Allergies Hydrocodone; Flexeril [cyclobenzaprine]; Septra [sulfamethoxazole-trimethoprim]; and Tramadol  Family History  Problem Relation Age of Onset  . Breast cancer Maternal Aunt        40's    Social History Social History   Tobacco Use  . Smoking status: Never Smoker  . Smokeless tobacco: Never Used  Substance Use Topics  . Alcohol use: No  . Drug use: No    Review of Systems Constitutional: Negative for fever. Cardiovascular: Negative for chest pain. Respiratory: Negative for shortness of breath. Musculoskeletal: Positive for right knee pain. Skin: Negative for open wounds or lesions.  Neurological: Negative for decrease in sensation  ____________________________________________   PHYSICAL EXAM:  VITAL SIGNS: ED Triage Vitals [05/15/18 1004]  Enc Vitals Group     BP 124/64     Pulse Rate 66     Resp 18     Temp 97.8 F (36.6 C)     Temp Source Oral     SpO2 100 %     Weight (!) 340 lb (154.2 kg)     Height 5\' 6"  (1.676 m)     Head Circumference      Peak Flow      Pain Score 10     Pain Loc      Pain Edu?  Excl. in [place]?     Constitutional: Alert and oriented. Well appearing and in no acute distress. Eyes: Conjunctivae are clear without discharge or drainage Head: Atraumatic Neck: Supple Respiratory: No cough. Respirations are even and unlabored. Musculoskeletal: No obvious deformity of the right knee. No obvious swelling. Patient unwilling to attempt movement or allow passive ROM. Neurologic: Motor and sensation is intact  Skin: No open wound or lesion over the right knee.  Psychiatric: Affect and behavior are appropriate.  ____________________________________________   LABS (all labs ordered are listed, but only abnormal results are displayed)  Labs Reviewed - No data to  display ____________________________________________  RADIOLOGY  Right knee negative for acute findings. Early medial compartment osteoarthritis. ____________________________________________   PROCEDURES  Procedures  ____________________________________________   INITIAL IMPRESSION / ASSESSMENT AND PLAN / ED COURSE  ZILPHIA KOZINSKI is a 32 y.o. who presents to the emergency department for treatment and evaluation of right knee pain.  She states while walking yesterday, she heard and felt a pop.  Since that time the knee has been painful.  X-rays are reassuring.  It does show some early medial osteoarthritis.  Patient is already taking meloxicam and has prescriptions at home for Robaxin and gabapentin.  She has a listed anaphylactic reaction to hydrocodone and tramadol.  Therefore, she was encouraged to take the meloxicam as prescribed and then Robaxin as needed.  Patient instructed to follow-up with orthopedics if not improving over the week.  She was also instructed to return to the emergency department for symptoms that change or worsen if unable schedule an appointment with orthopedics or primary care.  Medications - No data to display  Pertinent labs & imaging results that were available during my care of the patient were reviewed by me and considered in my medical decision making (see chart for details).  _________________________________________   FINAL CLINICAL IMPRESSION(S) / ED DIAGNOSES  Final diagnoses:  Osteoarthritis of right knee, unspecified osteoarthritis type    ED Discharge Orders    None       If controlled substance prescribed during this visit, 12 month history viewed on the [place] prior to issuing an initial prescription for Schedule II or III opiod.    Victorino Dike, FNP 05/15/18 1202    Nena Polio, MD 05/15/18 [phone]

## 2019-09-22 ENCOUNTER — Ambulatory Visit: Payer: Medicaid Other

## 2019-12-12 NOTE — Progress Notes (Deleted)
   [place]  [phone] [phone]  Patient Care Team: Donnie Coffin, MD as PCP - General (Family Medicine) Rico Junker, RN as Registered Nurse Theodore Demark, RN as Registered Nurse   Name of the patient: Christine Anderson  [id number]  [birth year]   Date of visit: 12/13/2019  SUBJECTIVE Christine Anderson is a G**P** female who presents today for a breast exam, mammogram, and PAP**?    Her last Mammogram was 11/2016.  Her last PAP smear was ***   OBJECTIVE ***VS ***EXAM    ASSESSMENT & PLAN:   Christine Anderson is a 34 year old female who presents to the [facility] clinic for continued health maintenance.

## 2019-12-13 ENCOUNTER — Ambulatory Visit: Payer: Medicaid Other | Attending: Oncology

## 2020-04-01 ENCOUNTER — Encounter: Payer: Self-pay | Admitting: Emergency Medicine

## 2020-04-01 ENCOUNTER — Emergency Department
Admission: EM | Admit: 2020-04-01 | Discharge: 2020-04-01 | Discharge status: Against Medical Advice | Payer: Managed Care, Other (non HMO) | Attending: Emergency Medicine | Admitting: Emergency Medicine

## 2020-04-01 ENCOUNTER — Other Ambulatory Visit: Payer: Self-pay

## 2020-04-01 DIAGNOSIS — G43909 Migraine, unspecified, not intractable, without status migrainosus: Secondary | ICD-10-CM | POA: Diagnosis present

## 2020-04-01 DIAGNOSIS — Z79899 Other long term (current) drug therapy: Secondary | ICD-10-CM | POA: Diagnosis not present

## 2020-04-01 DIAGNOSIS — G43809 Other migraine, not intractable, without status migrainosus: Secondary | ICD-10-CM

## 2020-04-01 DIAGNOSIS — E039 Hypothyroidism, unspecified: Secondary | ICD-10-CM | POA: Insufficient documentation

## 2020-04-01 MED ORDER — ACETAMINOPHEN 500 MG PO TABS
1000.0000 mg | ORAL_TABLET | Freq: Once | ORAL | Status: AC
  Administered 2020-04-01: 1000 mg via ORAL
  Filled 2020-04-01: qty 2

## 2020-04-01 MED ORDER — SODIUM CHLORIDE 0.9 % IV BOLUS
1000.0000 mL | Freq: Once | INTRAVENOUS | Status: AC
  Administered 2020-04-01: 1000 mL via INTRAVENOUS

## 2020-04-01 MED ORDER — METOCLOPRAMIDE HCL 5 MG/ML IJ SOLN
10.0000 mg | Freq: Once | INTRAMUSCULAR | Status: AC
  Administered 2020-04-01: 10 mg via INTRAVENOUS
  Filled 2020-04-01: qty 2

## 2020-04-01 MED ORDER — KETOROLAC TROMETHAMINE 30 MG/ML IJ SOLN
30.0000 mg | Freq: Once | INTRAMUSCULAR | Status: AC
  Administered 2020-04-01: 30 mg via INTRAVENOUS
  Filled 2020-04-01: qty 1

## 2020-04-01 NOTE — ED Triage Notes (Signed)
Patient with complaint of migraine that started yesterday. Patient states that she has taken OTC medication and Fioricet with no relief.

## 2020-04-01 NOTE — ED Notes (Signed)
Patient rang call bell to state she wanted the IV out of her arm; that it was irritating her. States she is ready to go home now. Asked if medicine helped her and she states yes; states she is just tired and ready to go. MD notified of same.

## 2020-04-01 NOTE — ED Triage Notes (Signed)
Pt called from [facility] to treatment room, no response 

## 2020-04-01 NOTE — ED Notes (Signed)
Resting in bed with son.

## 2020-04-01 NOTE — ED Provider Notes (Signed)
[facility] Emergency Department Provider Note  Time seen: 7:59 AM  I have reviewed the triage vital signs and the nursing notes.   HISTORY  Chief Complaint Migraine   HPI Christine Anderson is a 34 y.o. female with a past medical history of anemia, anxiety, migraines, presents to the emergency department for migraine headache.  According to the patient since yesterday around noon she has been experiencing a migraine, worse with noise and light.  States it feels like her typical migraine but did not go away after taking Aleve.  Patient states she also had 1 Fioricet tablet left over from a prior migraine that she took without significant relief.  Patient denies any fever cough congestion or shortness of breath.  Patient has been vaccinated against COVID-19.  No weakness or numbness.  Largely negative review of systems otherwise.   Past Medical History:  Diagnosis Date  . Anemia   . Anxiety   . Chronic back pain   . Hypothyroidism   . Migraines     There are no problems to display for this patient.   Past Surgical History:  Procedure Laterality Date  . CESAREAN SECTION     x3  . ELBOW FRACTURE SURGERY Right   . HARDWARE REMOVAL Right 09/08/2016   Procedure: HARDWARE REMOVAL;  Surgeon: Earnestine Leys, MD;  Location: [facility] ORS;  Service: Orthopedics;  Laterality: Right;  Right elbow    Prior to Admission medications   Medication Sig Start Date End Date Taking? Authorizing Provider  Acetaminophen-Codeine (TYLENOL/CODEINE #3) 300-30 MG tablet Take 1 tablet by mouth every 4 (four) hours as needed for pain.    [provider]  Acetaminophen-Codeine 300-30 MG tablet Take 1 tablet by mouth every 6 (six) hours as needed for pain. 09/08/16   Earnestine Leys, MD  gabapentin (NEURONTIN) 400 MG capsule Take 400 mg by mouth 2 (two) times daily.    [provider]  levothyroxine (SYNTHROID, LEVOTHROID) 100 MCG tablet Take 100 mcg by mouth daily before  breakfast.    [provider]  meloxicam (MOBIC) 15 MG tablet Take 15 mg by mouth daily.    [provider]  sertraline (ZOLOFT) 25 MG tablet Take 25 mg by mouth every morning.     [provider]  SUMAtriptan (IMITREX) 100 MG tablet Take 100 mg by mouth every 2 (two) hours as needed for migraine. May repeat in 2 hours if headache persists or recurs.    [provider]    Allergies  Allergen Reactions  . Hydrocodone Anaphylaxis  . Flexeril [Cyclobenzaprine]   . Septra [Sulfamethoxazole-Trimethoprim]   . Tramadol     Family History  Problem Relation Age of Onset  . Breast cancer Maternal Aunt        40's    Social History Social History   Tobacco Use  . Smoking status: Never Smoker  . Smokeless tobacco: Never Used  Substance Use Topics  . Alcohol use: No  . Drug use: No    Review of Systems Constitutional: Negative for fever. Cardiovascular: Negative for chest pain. Respiratory: Negative for shortness of breath. Gastrointestinal: Negative for abdominal pain, vomiting Genitourinary: Negative for urinary compaints Musculoskeletal: Negative for musculoskeletal complaints Skin: Negative for skin complaints  Neurological: Moderate to significant headache.  No weakness or numbness. All other ROS negative  ____________________________________________   PHYSICAL EXAM:  VITAL SIGNS: ED Triage Vitals  Enc Vitals Group     BP 04/01/20 0350 136/77     Pulse  Rate 04/01/20 0350 61     Resp 04/01/20 0350 18     Temp 04/01/20 0350 99.2 F (37.3 C)     Temp Source 04/01/20 0350 Oral     SpO2 04/01/20 0350 98 %     Weight 04/01/20 0351 (!) 324 lb (147 kg)     Height 04/01/20 0351 5\' 7"  (1.702 m)     Head Circumference --      Peak Flow --      Pain Score 04/01/20 0351 10     Pain Loc --      Pain Edu? --      Excl. in [place]? --     Constitutional: Alert and oriented. Well appearing and in no distress. Eyes: Normal exam ENT      Head:  Normocephalic and atraumatic.      Mouth/Throat: Mucous membranes are moist. Cardiovascular: Normal rate, regular rhythm. Respiratory: Normal respiratory effort without tachypnea nor retractions. Breath sounds are clear Gastrointestinal: Soft and nontender. No distention. Musculoskeletal: Nontender with normal range of motion in all extremities. Neurologic:  Normal speech and language. No gross focal neurologic deficits  Skin:  Skin is warm, dry and intact.  Psychiatric: Mood and affect are normal.  ____________________________________________    INITIAL IMPRESSION / ASSESSMENT AND PLAN / ED COURSE  Pertinent labs & imaging results that were available during my care of the patient were reviewed by me and considered in my medical decision making (see chart for details).   Patient presents to the emergency department for a headache since 12 PM yesterday.  Patient states a history of migraine headaches approximately once per week.  Patient has come to the hospital in the past for a headache.  Currently the patient appears well but does state significant headache.  We will dose IV fluids Toradol Reglan Tylenol.  Patient has no ride home besides her self so we will hold off on diphenhydramine.  We will continue to closely monitor.  Given her reassuring physical exam, vitals, and history of migraines I do not believe further work-up such as lab work or imaging is necessary at this time however we will continue to reassess.  Shortly after receiving the medication patient called the nurse into the room and asked her to remove the IV.  Patient states she was feeling better and wanted to go home, so she was tired and did not want a wait.  Patient left the emergency department before I could re-evaluate.    Christine Anderson was evaluated in Emergency Department on 04/01/2020 for the symptoms described in the history of present illness. She was evaluated in the context of the global COVID-19 pandemic,  which necessitated consideration that the patient might be at risk for infection with the SARS-CoV-2 virus that causes COVID-19. Institutional protocols and algorithms that pertain to the evaluation of patients at risk for COVID-19 are in a state of rapid change based on information released by regulatory bodies including the CDC and federal and state organizations. These policies and algorithms were followed during the patient's care in the ED.  ____________________________________________   FINAL CLINICAL IMPRESSION(S) / ED DIAGNOSES  Migraine   Harvest Dark, MD 04/01/20 [phone]

## 2020-04-01 NOTE — ED Notes (Signed)
MD in to discuss discharge with patient but patient had left her treatment room.

## 2020-06-24 ENCOUNTER — Emergency Department: Payer: Medicaid Other

## 2020-06-24 ENCOUNTER — Emergency Department
Admission: EM | Admit: 2020-06-24 | Discharge: 2020-06-24 | Disposition: A | Discharge status: Home / Self Care | Payer: Medicaid Other | Attending: Emergency Medicine | Admitting: Emergency Medicine

## 2020-06-24 ENCOUNTER — Other Ambulatory Visit: Payer: Self-pay

## 2020-06-24 DIAGNOSIS — Z79899 Other long term (current) drug therapy: Secondary | ICD-10-CM | POA: Insufficient documentation

## 2020-06-24 DIAGNOSIS — D649 Anemia, unspecified: Secondary | ICD-10-CM

## 2020-06-24 DIAGNOSIS — R52 Pain, unspecified: Secondary | ICD-10-CM

## 2020-06-24 DIAGNOSIS — N939 Abnormal uterine and vaginal bleeding, unspecified: Secondary | ICD-10-CM | POA: Insufficient documentation

## 2020-06-24 DIAGNOSIS — E039 Hypothyroidism, unspecified: Secondary | ICD-10-CM | POA: Insufficient documentation

## 2020-06-24 DIAGNOSIS — R102 Pelvic and perineal pain: Secondary | ICD-10-CM

## 2020-06-24 LAB — BASIC METABOLIC PANEL
Anion gap: 8 (ref 5–15)
BUN: 11 mg/dL (ref 6–20)
CO2: 25 mmol/L (ref 22–32)
Calcium: 9.1 mg/dL (ref 8.9–10.3)
Chloride: 106 mmol/L (ref 98–111)
Creatinine, Ser: 0.91 mg/dL (ref 0.44–1.00)
GFR, Estimated: >60 mL/min (ref >60)
Glucose, Bld: 111 mg/dL — ABNORMAL HIGH (ref 70–99)
Potassium: 4.1 mmol/L (ref 3.5–5.1)
Sodium: 139 mmol/L (ref 135–145)

## 2020-06-24 LAB — CBC WITH DIFFERENTIAL/PLATELET
Abs Immature Granulocytes: 0.01 10*3/uL (ref 0.00–0.07)
Basophils Absolute: 0 10*3/uL (ref 0.0–0.1)
Basophils Relative: 0 %
Eosinophils Absolute: 0.1 10*3/uL (ref 0.0–0.5)
Eosinophils Relative: 2 %
HCT: 33.3 % — ABNORMAL LOW (ref 36.0–46.0)
Hemoglobin: 9.7 g/dL — ABNORMAL LOW (ref 12.0–15.0)
Immature Granulocytes: 0 %
Lymphocytes Relative: 52 %
Lymphs Abs: 2.9 10*3/uL (ref 0.7–4.0)
MCH: 22.3 pg — ABNORMAL LOW (ref 26.0–34.0)
MCHC: 29.1 g/dL — ABNORMAL LOW (ref 30.0–36.0)
MCV: 76.6 fL — ABNORMAL LOW (ref 80.0–100.0)
Monocytes Absolute: 0.5 10*3/uL (ref 0.1–1.0)
Monocytes Relative: 8 %
Neutro Abs: 2.1 10*3/uL (ref 1.7–7.7)
Neutrophils Relative %: 38 %
Platelets: 306 10*3/uL (ref 150–400)
RBC: 4.35 MIL/uL (ref 3.87–5.11)
RDW: 14.8 % (ref 11.5–15.5)
Smear Review: NORMAL
WBC: 5.6 10*3/uL (ref 4.0–10.5)
nRBC: 0 % (ref 0.0–0.2)

## 2020-06-24 LAB — HCG, QUANTITATIVE, PREGNANCY: hCG, Beta Chain, Quant, S: <1 m[IU]/mL (ref <5)

## 2020-06-24 MED ORDER — FERROUS SULFATE 325 (65 FE) MG PO TABS: 325.0000 mg | ORAL_TABLET | Freq: Every day | ORAL | 3 refills | Status: AC

## 2020-06-24 NOTE — Discharge Instructions (Signed)
Your hemoglobin today was 9.7.  I recommend that you start taking iron supplementation.  Your ultrasound showed no significant abnormality.  I recommend following up with OB/GYN for your heavy menstrual cycle.  If you begin soaking more than 1 pad an hour for more than 2 straight hours, feel lightheaded like you might pass out or do pass out, have severe abdominal pain, vomiting that does not stop, fever of 100.4 or higher, please return to the emergency department.

## 2020-06-24 NOTE — ED Provider Notes (Signed)
[facility] Emergency Department Provider Note   ____________________________________________   Event Date/Time   First MD Initiated Contact with Patient 06/24/20 [phone]     (approximate)  I have reviewed the triage vital signs and the nursing notes.   HISTORY  Chief Complaint Vaginal Bleeding    HPI Christine Anderson is a 35 y.o. female with history of obesity, hypothyroidism who presents to the emergency department with heavy vaginal bleeding.  States that this is her menstrual period but normally last only 3 days and has now lasted her 7.  She feels she is bleeding heavier than normal and is passing small clots.  No abdominal pain, fevers, vomiting, discharge.  No dizziness.  Does not have an OB/GYN.  Has had 3 previous C-sections.  She is not concerned for STDs.     Past Medical History:  Diagnosis Date  . Anemia   . Anxiety   . Chronic back pain   . Hypothyroidism   . Migraines     There are no problems to display for this patient.   Past Surgical History:  Procedure Laterality Date  . CESAREAN SECTION     x3  . ELBOW FRACTURE SURGERY Right   . HARDWARE REMOVAL Right 09/08/2016   Procedure: HARDWARE REMOVAL;  Surgeon: Earnestine Leys, MD;  Location: [facility] ORS;  Service: Orthopedics;  Laterality: Right;  Right elbow    Prior to Admission medications   Medication Sig Start Date End Date Taking? Authorizing Provider  Acetaminophen-Codeine (TYLENOL/CODEINE #3) 300-30 MG tablet Take 1 tablet by mouth every 4 (four) hours as needed for pain.    [provider]  Acetaminophen-Codeine 300-30 MG tablet Take 1 tablet by mouth every 6 (six) hours as needed for pain. 09/08/16   Earnestine Leys, MD  gabapentin (NEURONTIN) 400 MG capsule Take 400 mg by mouth 2 (two) times daily.    [provider]  levothyroxine (SYNTHROID, LEVOTHROID) 100 MCG tablet Take 100 mcg by mouth daily before breakfast.    [provider]  meloxicam (MOBIC)  15 MG tablet Take 15 mg by mouth daily.    [provider]  sertraline (ZOLOFT) 25 MG tablet Take 25 mg by mouth every morning.     [provider]  SUMAtriptan (IMITREX) 100 MG tablet Take 100 mg by mouth every 2 (two) hours as needed for migraine. May repeat in 2 hours if headache persists or recurs.    [provider]    Allergies Hydrocodone, Flexeril [cyclobenzaprine], Septra [sulfamethoxazole-trimethoprim], and Tramadol  Family History  Problem Relation Age of Onset  . Breast cancer Maternal Aunt        40's    Social History Social History   Tobacco Use  . Smoking status: Never Smoker  . Smokeless tobacco: Never Used  Substance Use Topics  . Alcohol use: No  . Drug use: No    Review of Systems  Constitutional: No fever/chills Eyes: No visual changes. ENT: No sore throat. Cardiovascular: Denies chest pain. Respiratory: Denies shortness of breath. Gastrointestinal: No abdominal pain.  No nausea, no vomiting.  No diarrhea.  No constipation. Genitourinary: Negative for dysuria. Musculoskeletal: Negative for back pain. Skin: Negative for rash. Neurological: Negative for headaches, focal weakness or numbness.   ____________________________________________   PHYSICAL EXAM:  VITAL SIGNS: ED Triage Vitals  Enc Vitals Group     BP 06/24/20 0310 113/60     Pulse Rate 06/24/20 0310 64     Resp 06/24/20 0310 18  Temp 06/24/20 0310 98.1 F (36.7 C)     Temp Source 06/24/20 0310 Oral     SpO2 06/24/20 0310 100 %     Weight 06/24/20 0306 (!) 342 lb (155.1 kg)     Height 06/24/20 0306 5\' 7"  (1.702 m)     Head Circumference --      Peak Flow --      Pain Score --      Pain Loc --      Pain Edu? --      Excl. in [place]? --     Constitutional: Alert and oriented. Well appearing and in no acute distress. Eyes: Conjunctivae are normal. PERRL. EOMI. Head: Atraumatic. Nose: No congestion/rhinnorhea. Mouth/Throat: Mucous membranes are moist.   Oropharynx non-erythematous. Neck: No stridor.   Cardiovascular: Normal rate, regular rhythm. Grossly normal heart sounds.  Good peripheral circulation. Respiratory: Normal respiratory effort.  No retractions. Lungs CTAB. Gastrointestinal: Soft and nontender. No distention. No abdominal bruits. No CVA tenderness. GU: Patient defers Musculoskeletal: No lower extremity tenderness nor edema.  No joint effusions. Neurologic:  Normal speech and language. No gross focal neurologic deficits are appreciated. No gait instability. Skin:  Skin is warm, dry and intact. No rash noted. Psychiatric: Mood and affect are normal. Speech and behavior are normal.  ____________________________________________   LABS (all labs ordered are listed, but only abnormal results are displayed)  Labs Reviewed  CBC WITH DIFFERENTIAL/PLATELET - Abnormal; Notable for the following components:      Result Value   Hemoglobin 9.7 (*)    HCT 33.3 (*)    MCV 76.6 (*)    MCH 22.3 (*)    MCHC 29.1 (*)    All other components within normal limits  BASIC METABOLIC PANEL - Abnormal; Notable for the following components:   Glucose, Bld 111 (*)    All other components within normal limits  HCG, QUANTITATIVE, PREGNANCY   ____________________________________________  EKG  None ____________________________________________  RADIOLOGY  ED MD interpretation: Ultrasound shows no acute abnormality.  Ovary on the right side not visualized.  Official radiology report(s): No results found.  ____________________________________________   PROCEDURES  Procedure(s) performed: None  Procedures  Critical Care performed: No  ____________________________________________   INITIAL IMPRESSION / ASSESSMENT AND PLAN / ED COURSE  As part of my medical decision making, I reviewed the following data within the [place] notes reviewed and incorporated, Labs reviewed and show anemia, Old chart  reviewed and Notes from prior ED visits  Patient is here for heavy vaginal bleeding.  She states that she was worried that something was wrong.  Her hemoglobin today is 9.7 but the last that we have for comparison was in 2018.  She is not actively hemorrhaging and is hemodynamically stable.  She declines pelvic exam.  States she is not concerned for STDs.  Her pregnancy test is normal.  Her pelvic ultrasound shows no acute abnormality.  Endometrium thickness is approximately 13 mm without focal abnormality.  Left ovary has normal blood flow but right ovary was not visualized.  She is not having any pain.  Low suspicion for torsion.  Have recommended close follow-up with OB/GYN given heavy menstrual cycle.  Discussed bleeding return precautions.  Patient declines STD testing today and defers pelvic exam.  I feel she is safe for discharge home with outpatient follow-up.  Will discharge on iron tablets.  At this time, I do not feel there is any life-threatening condition present. I have reviewed, interpreted and  discussed all results (EKG, imaging, lab, urine as appropriate) and exam findings with patient/family. I have reviewed nursing notes and appropriate previous records.  I feel the patient is safe to be discharged home without further emergent workup and can continue workup as an outpatient as needed. Discussed usual and customary return precautions. Patient/family verbalize understanding and are comfortable with this plan.  Outpatient follow-up has been provided as needed. All questions have been answered.       ____________________________________________   FINAL CLINICAL IMPRESSION(S) / ED DIAGNOSES  Final diagnoses:  Pain  Vaginal bleeding  Anemia, unspecified type     ED Discharge Orders         Ordered    ferrous sulfate 325 (65 FE) MG tablet  Daily        06/24/20 0704           Note:  This document was prepared using Dragon voice recognition software and may include  unintentional dictation errors.    Diavion Labrador, Delice Bison, DO 06/24/20 [phone]

## 2020-06-24 NOTE — ED Notes (Signed)
Patient transported to Ultrasound 

## 2020-06-24 NOTE — ED Notes (Signed)
Report to katie, rn 

## 2020-06-24 NOTE — ED Notes (Signed)
Pt returned from ultrasound

## 2020-06-24 NOTE — ED Triage Notes (Signed)
Patient reports her normal menstrual cycle is 3 days and she had that last week.  Several days later she started bleeding again and it is much heavier, state using a tampon per hour.

## 2020-06-28 ENCOUNTER — Ambulatory Visit: Payer: Medicaid Other | Admitting: Obstetrics and Gynecology

## 2020-11-17 ENCOUNTER — Emergency Department
Admission: EM | Admit: 2020-11-17 | Discharge: 2020-11-17 | Disposition: A | Discharge status: Home / Self Care | Payer: Medicaid Other | Attending: Emergency Medicine | Admitting: Emergency Medicine

## 2020-11-17 ENCOUNTER — Other Ambulatory Visit: Payer: Self-pay

## 2020-11-17 ENCOUNTER — Encounter: Payer: Self-pay | Admitting: Emergency Medicine

## 2020-11-17 DIAGNOSIS — E039 Hypothyroidism, unspecified: Secondary | ICD-10-CM | POA: Insufficient documentation

## 2020-11-17 DIAGNOSIS — N939 Abnormal uterine and vaginal bleeding, unspecified: Secondary | ICD-10-CM

## 2020-11-17 DIAGNOSIS — N938 Other specified abnormal uterine and vaginal bleeding: Secondary | ICD-10-CM | POA: Insufficient documentation

## 2020-11-17 DIAGNOSIS — Z79899 Other long term (current) drug therapy: Secondary | ICD-10-CM | POA: Diagnosis not present

## 2020-11-17 LAB — BASIC METABOLIC PANEL
Anion gap: 6 (ref 5–15)
BUN: 12 mg/dL (ref 6–20)
CO2: 27 mmol/L (ref 22–32)
Calcium: 9.6 mg/dL (ref 8.9–10.3)
Chloride: 107 mmol/L (ref 98–111)
Creatinine, Ser: 0.85 mg/dL (ref 0.44–1.00)
GFR, Estimated: >60 mL/min (ref >60)
Glucose, Bld: 102 mg/dL — ABNORMAL HIGH (ref 70–99)
Potassium: 4 mmol/L (ref 3.5–5.1)
Sodium: 140 mmol/L (ref 135–145)

## 2020-11-17 LAB — POC URINE PREG, ED: Preg Test, Ur: NEGATIVE

## 2020-11-17 LAB — CBC
HCT: 30.1 % — ABNORMAL LOW (ref 36.0–46.0)
Hemoglobin: 8.7 g/dL — ABNORMAL LOW (ref 12.0–15.0)
MCH: 21.2 pg — ABNORMAL LOW (ref 26.0–34.0)
MCHC: 28.9 g/dL — ABNORMAL LOW (ref 30.0–36.0)
MCV: 73.4 fL — ABNORMAL LOW (ref 80.0–100.0)
Platelets: 314 10*3/uL (ref 150–400)
RBC: 4.1 MIL/uL (ref 3.87–5.11)
RDW: 16.2 % — ABNORMAL HIGH (ref 11.5–15.5)
WBC: 6.6 10*3/uL (ref 4.0–10.5)
nRBC: 0 % (ref 0.0–0.2)

## 2020-11-17 LAB — TYPE AND SCREEN
ABO/RH(D): AB POS
Antibody Screen: NEGATIVE

## 2020-11-17 MED ORDER — NORGESTIM-ETH ESTRAD TRIPHASIC 0.18/0.215/0.25 MG-25 MCG PO TABS: 1.0000 | ORAL_TABLET | Freq: Every day | ORAL | 11 refills | Status: DC

## 2020-11-17 NOTE — ED Triage Notes (Signed)
Pt via POV from home. Pt c/o vaginal bleeding since 0700 this AM. Pt states she is passing blood clots and has been through 5 pads since 0700 this AM. Denies NVD. Denies pain. LMP 5/12. Pt is A&Ox4 and NAD.

## 2020-11-17 NOTE — ED Provider Notes (Signed)
[facility] Emergency Department Provider Note   ____________________________________________   Event Date/Time   First MD Initiated Contact with Patient 11/17/20 1812     (approximate)  I have reviewed the triage vital signs and the nursing notes.   HISTORY  Chief Complaint Vaginal Bleeding    HPI Christine Anderson is a 35 y.o. female with below stated past medical history as well as a history of abnormal uterine bleeding who presents for heavy vaginal bleeding today that she states has soaked 5 pads since 7 AM.  Patient states that her LMP on 5/12.  Patient is concerned as she expected to start her period next week.  Patient states that she has had episodes similar to this during which she has had heavy vaginal bleeding for approximately 2 weeks.  Patient never followed up with the OB/GYN after this.  Patient states that she has not been on birth control for the past 9 years nor has she seen an OB/GYN in this time.  Patient denies any unprotected sexual activity.  Patient currently denies any vision changes, tinnitus, difficulty speaking, facial droop, sore throat, chest pain, shortness of breath, nausea/vomiting/diarrhea, dysuria, or weakness/numbness/paresthesias in any extremity         Past Medical History:  Diagnosis Date   Anemia    Anxiety    Chronic back pain    Hypothyroidism    Migraines     There are no problems to display for this patient.   Past Surgical History:  Procedure Laterality Date   CESAREAN SECTION     x3   ELBOW FRACTURE SURGERY Right    HARDWARE REMOVAL Right 09/08/2016   Procedure: HARDWARE REMOVAL;  Surgeon: Earnestine Leys, MD;  Location: [facility] ORS;  Service: Orthopedics;  Laterality: Right;  Right elbow    Prior to Admission medications   Medication Sig Start Date End Date Taking? Authorizing Provider  Norgestimate-Ethinyl Estradiol Triphasic (ORTHO TRI-CYCLEN LO) 0.18/0.215/0.25 MG-25 MCG tab Take 1 tablet by mouth  daily. 11/17/20 11/17/21 Yes Livia Tarr, Vista Lawman, MD  Acetaminophen-Codeine (TYLENOL/CODEINE #3) 300-30 MG tablet Take 1 tablet by mouth every 4 (four) hours as needed for pain.    [provider]  Acetaminophen-Codeine 300-30 MG tablet Take 1 tablet by mouth every 6 (six) hours as needed for pain. 09/08/16   Earnestine Leys, MD  ferrous sulfate 325 (65 FE) MG tablet Take 1 tablet (325 mg total) by mouth daily. 06/24/20 06/24/21  Ward, Delice Bison, DO  gabapentin (NEURONTIN) 400 MG capsule Take 400 mg by mouth 2 (two) times daily.    [provider]  levothyroxine (SYNTHROID, LEVOTHROID) 100 MCG tablet Take 100 mcg by mouth daily before breakfast.    [provider]  meloxicam (MOBIC) 15 MG tablet Take 15 mg by mouth daily.    [provider]  sertraline (ZOLOFT) 25 MG tablet Take 25 mg by mouth every morning.     [provider]  SUMAtriptan (IMITREX) 100 MG tablet Take 100 mg by mouth every 2 (two) hours as needed for migraine. May repeat in 2 hours if headache persists or recurs.    [provider]    Allergies Hydrocodone, Flexeril [cyclobenzaprine], Septra [sulfamethoxazole-trimethoprim], and Tramadol  Family History  Problem Relation Age of Onset   Breast cancer Maternal Aunt        40's    Social History Social History   Tobacco Use   Smoking status: Never   Smokeless tobacco: Never  Substance Use Topics  Alcohol use: No   Drug use: No    Review of Systems Constitutional: No fever/chills Eyes: No visual changes. ENT: No sore throat. Cardiovascular: Denies chest pain. Respiratory: Denies shortness of breath. Gastrointestinal: No abdominal pain.  No nausea, no vomiting.  No diarrhea. Genitourinary: Negative for dysuria.  Endorses heavy vaginal bleeding Musculoskeletal: Negative for acute arthralgias Skin: Negative for rash. Neurological: Negative for headaches, weakness/numbness/paresthesias in any extremity Psychiatric:  Negative for suicidal ideation/homicidal ideation   ____________________________________________   PHYSICAL EXAM:  VITAL SIGNS: ED Triage Vitals  Enc Vitals Group     BP 11/17/20 1506 139/63     Pulse Rate 11/17/20 1503 70     Resp 11/17/20 1503 20     Temp 11/17/20 1503 98.6 F (37 C)     Temp src --      SpO2 11/17/20 1503 100 %     Weight 11/17/20 1504 (!) 345 lb (156.5 kg)     Height 11/17/20 1504 5\' 6"  (1.676 m)     Head Circumference --      Peak Flow --      Pain Score --      Pain Loc --      Pain Edu? --      Excl. in [place]? --    Constitutional: Alert and oriented. Well appearing and in no acute distress. Eyes: Conjunctivae are normal. PERRL. Head: Atraumatic. Nose: No congestion/rhinnorhea. Mouth/Throat: Mucous membranes are moist. Neck: No stridor Cardiovascular: Grossly normal heart sounds.  Good peripheral circulation. Respiratory: Normal respiratory effort.  No retractions. Gastrointestinal: Soft and nontender. No distention. Genitourinary: Patient deferred pelvic exam Musculoskeletal: No obvious deformities Neurologic:  Normal speech and language. No gross focal neurologic deficits are appreciated. Skin:  Skin is warm and dry. No rash noted. Psychiatric: Mood and affect are normal. Speech and behavior are normal.  ____________________________________________   LABS (all labs ordered are listed, but only abnormal results are displayed)  Labs Reviewed  BASIC METABOLIC PANEL - Abnormal; Notable for the following components:      Result Value   Glucose, Bld 102 (*)    All other components within normal limits  CBC - Abnormal; Notable for the following components:   Hemoglobin 8.7 (*)    HCT 30.1 (*)    MCV 73.4 (*)    MCH 21.2 (*)    MCHC 28.9 (*)    RDW 16.2 (*)    All other components within normal limits  POC URINE PREG, ED  TYPE AND SCREEN   PROCEDURES  Procedure(s) performed (including Critical Care):  .1-3 Lead EKG  Interpretation  Date/Time: 11/17/2020 6:24 PM Performed by: Naaman Plummer, MD Authorized by: Naaman Plummer, MD     Interpretation: normal     ECG rate:  69   ECG rate assessment: normal     Rhythm: sinus rhythm     Ectopy: none     Conduction: normal     ____________________________________________   INITIAL IMPRESSION / ASSESSMENT AND PLAN / ED COURSE  As part of my medical decision making, I reviewed the following data within the [place] notes reviewed and incorporated, Labs reviewed, EKG interpreted, Old chart reviewed, Radiograph reviewed and Notes from prior ED visits reviewed and incorporated     1 day vaginal bleeding most likely of nonemergent etiology.  Workup: CBC, BMP, UA, bHCG, Type&Screen  Based on History, Exam, and Workup I believe the patient's presentation not consistent with ectopic pregnancy, molar pregnancy, life-threatening  coagulopathy, trauma, serious bacterial infection, central process or other emergency.  Patient Stable Appearing and presentation most likely secondary to fibroids or other non-emergent cause of abnormal uterine bleeding.  Disposition: Will discharge home with return precautions and instruction for prompt OBGYN follow up.       ____________________________________________   FINAL CLINICAL IMPRESSION(S) / ED DIAGNOSES  Final diagnoses:  Dysfunctional uterine bleeding  Vaginal bleeding     ED Discharge Orders          Ordered    Norgestimate-Ethinyl Estradiol Triphasic (ORTHO TRI-CYCLEN LO) 0.18/0.215/0.25 MG-25 MCG tab  Daily        11/17/20 1821             Note:  This document was prepared using Dragon voice recognition software and may include unintentional dictation errors.    Naaman Plummer, MD 11/17/20 [phone]

## 2020-11-30 ENCOUNTER — Other Ambulatory Visit: Payer: Self-pay

## 2020-11-30 ENCOUNTER — Other Ambulatory Visit (HOSPITAL_COMMUNITY)
Admission: RE | Admit: 2020-11-30 | Discharge: 2020-11-30 | Disposition: A | Discharge status: Home / Self Care | Payer: Medicaid Other | Source: Ambulatory Visit | Attending: Obstetrics | Admitting: Obstetrics

## 2020-11-30 ENCOUNTER — Ambulatory Visit (INDEPENDENT_AMBULATORY_CARE_PROVIDER_SITE_OTHER): Payer: Medicaid Other | Admitting: Obstetrics

## 2020-11-30 ENCOUNTER — Encounter: Payer: Self-pay | Admitting: Obstetrics

## 2020-11-30 VITALS — BP 128/82 | Ht 67.0 in | Wt 354.0 lb

## 2020-11-30 DIAGNOSIS — N920 Excessive and frequent menstruation with regular cycle: Secondary | ICD-10-CM

## 2020-11-30 DIAGNOSIS — N939 Abnormal uterine and vaginal bleeding, unspecified: Secondary | ICD-10-CM | POA: Diagnosis not present

## 2020-11-30 DIAGNOSIS — Z113 Encounter for screening for infections with a predominantly sexual mode of transmission: Secondary | ICD-10-CM | POA: Diagnosis present

## 2020-11-30 LAB — POCT URINE PREGNANCY: Preg Test, Ur: NEGATIVE

## 2020-12-03 ENCOUNTER — Telehealth: Payer: Self-pay

## 2020-12-03 NOTE — Telephone Encounter (Signed)
Pt calling; wants a call back.  [phone]  "the number you dialed has been changed or is no longer in service"

## 2020-12-04 LAB — CERVICOVAGINAL ANCILLARY ONLY
Bacterial Vaginitis (gardnerella): NEGATIVE
Candida Glabrata: NEGATIVE
Candida Vaginitis: NEGATIVE
Chlamydia: NEGATIVE
Comment: NEGATIVE
Comment: NEGATIVE
Comment: NEGATIVE
Comment: NEGATIVE
Comment: NEGATIVE
Comment: NORMAL
Neisseria Gonorrhea: NEGATIVE
Trichomonas: POSITIVE — AB

## 2020-12-04 NOTE — Telephone Encounter (Signed)
Tried calling for the second time today.  Same msg.

## 2020-12-05 ENCOUNTER — Encounter: Payer: Self-pay | Admitting: Obstetrics

## 2020-12-05 ENCOUNTER — Other Ambulatory Visit: Payer: Self-pay | Admitting: Obstetrics

## 2020-12-05 DIAGNOSIS — A599 Trichomoniasis, unspecified: Secondary | ICD-10-CM

## 2020-12-05 MED ORDER — METRONIDAZOLE 500 MG PO TABS: 500.0000 mg | ORAL_TABLET | Freq: Two times a day (BID) | ORAL | 0 refills | Status: AC

## 2020-12-05 NOTE — Progress Notes (Unsigned)
Attempted twice to reach this patient with her lab results. She needs treaetment for Trichomonas. I will send in a prescription for her, and send her a letter through [facility].  Imagene Riches, CNM  12/05/2020 5:09 PM

## 2020-12-06 NOTE — Telephone Encounter (Signed)
Third attempt to call pt; same msg; [facility] has sent her a letter and rx as well; has appt c [facility] 12/31/20.

## 2020-12-06 NOTE — Telephone Encounter (Signed)
Pt aware of positive trich and rx is at the pharm. Pt asked how she got the trich; adv it is sexually txd; to not have sex with partner again until he is txd or they will just pass it back and forth.  Pt states she is not with any more.

## 2020-12-07 ENCOUNTER — Other Ambulatory Visit: Payer: Self-pay | Admitting: Obstetrics

## 2020-12-07 DIAGNOSIS — Z3041 Encounter for surveillance of contraceptive pills: Secondary | ICD-10-CM

## 2020-12-07 MED ORDER — NORGESTIMATE-ETH ESTRADIOL 0.25-35 MG-MCG PO TABS: 1.0000 | ORAL_TABLET | Freq: Every day | ORAL | 11 refills | Status: AC

## 2020-12-11 ENCOUNTER — Other Ambulatory Visit: Payer: Self-pay | Admitting: Obstetrics

## 2020-12-11 DIAGNOSIS — N939 Abnormal uterine and vaginal bleeding, unspecified: Secondary | ICD-10-CM

## 2020-12-27 ENCOUNTER — Ambulatory Visit: Payer: Medicaid Other

## 2020-12-31 ENCOUNTER — Ambulatory Visit: Payer: Medicaid Other | Admitting: Obstetrics and Gynecology

## 2021-03-26 ENCOUNTER — Ambulatory Visit: Payer: Medicaid Other | Admitting: Obstetrics

## 2021-03-26 ENCOUNTER — Ambulatory Visit: Payer: Medicaid Other | Admitting: Podiatry

## 2021-03-28 ENCOUNTER — Telehealth: Payer: Self-pay

## 2021-03-28 NOTE — Telephone Encounter (Signed)
Pt tx'd from [facility]; is changing tampon every hour on the hour; states she will bleed through if she doesn't; has never soiled clothes or bed until now.  This is the second time she has bleed this heavily; she is scared, and tired - didn't sleep last night.  Adv per policy to go to ED.

## 2021-07-15 ENCOUNTER — Ambulatory Visit (LOCAL_COMMUNITY_HEALTH_CENTER): Payer: Self-pay

## 2021-07-15 ENCOUNTER — Other Ambulatory Visit: Payer: Self-pay

## 2021-07-15 DIAGNOSIS — Z111 Encounter for screening for respiratory tuberculosis: Secondary | ICD-10-CM

## 2021-07-18 ENCOUNTER — Other Ambulatory Visit: Payer: Self-pay

## 2021-07-18 ENCOUNTER — Ambulatory Visit (LOCAL_COMMUNITY_HEALTH_CENTER): Payer: Medicaid Other

## 2021-07-18 DIAGNOSIS — Z111 Encounter for screening for respiratory tuberculosis: Secondary | ICD-10-CM

## 2021-07-18 LAB — TB SKIN TEST
Induration: 0 mm
TB Skin Test: NEGATIVE

## 2021-07-18 NOTE — Progress Notes (Signed)
Verified client had paid for PPD prior to reading today (as per instruction in appt desk). Rich Number, RN

## 2021-10-02 IMAGING — US US PELVIS COMPLETE TRANSABD/TRANSVAG W DUPLEX
1 series · 13 of 25 positions shown · non-contrast
Comparison: None.

CLINICAL DATA: Pain and bleeding

EXAM:
TRANSABDOMINAL AND TRANSVAGINAL ULTRASOUND OF PELVIS
DOPPLER ULTRASOUND OF OVARIES
TECHNIQUE: Both transabdominal and transvaginal ultrasound examinations of the
pelvis were performed. Transabdominal technique was performed for
global imaging of the pelvis including uterus, ovaries, adnexal
regions, and pelvic cul-de-sac.
It was necessary to proceed with endovaginal exam following the
transabdominal exam to visualize the ovaries. Color and duplex
Doppler ultrasound was utilized to evaluate blood flow to the
ovaries.

[Series 1: us pelvis (transabdominal only) · 13 of 50 slices shown]
[im 1/50]
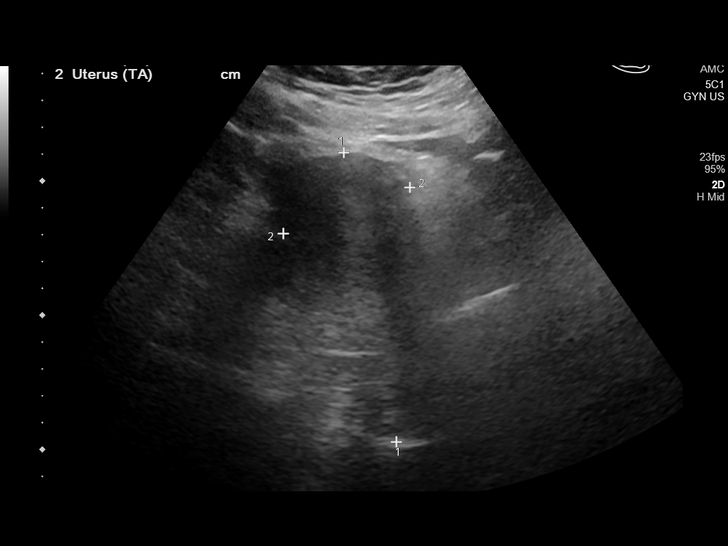
[im 5/50]
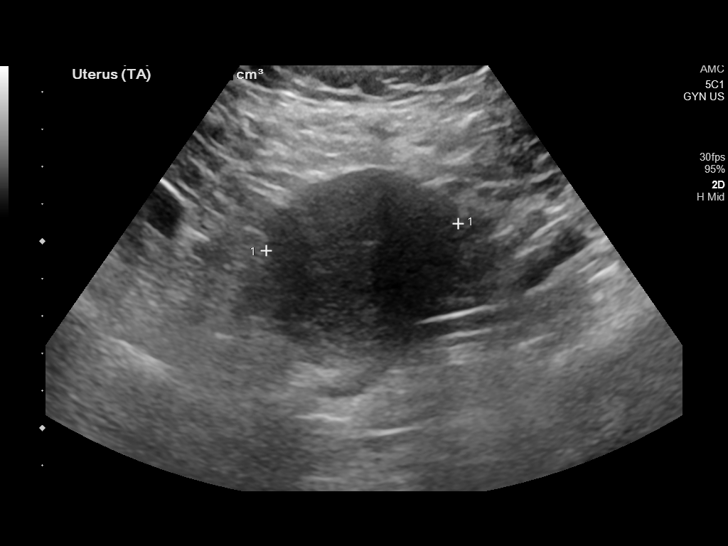
[im 9/50]
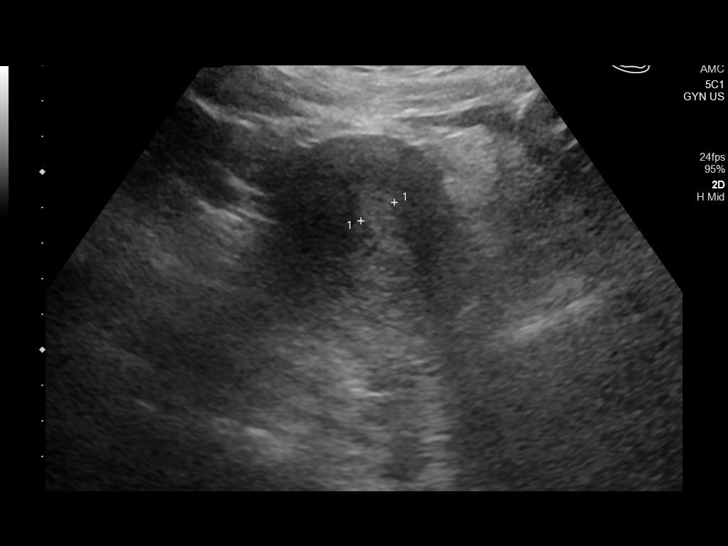
[im 13/50]
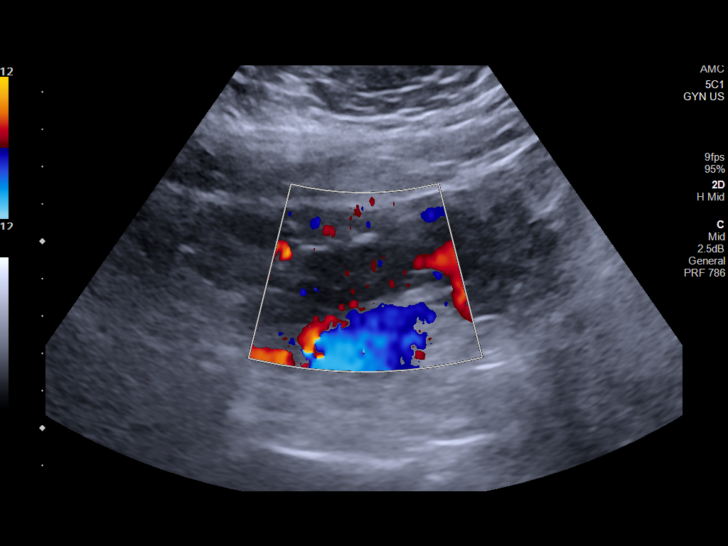
[im 17/50]
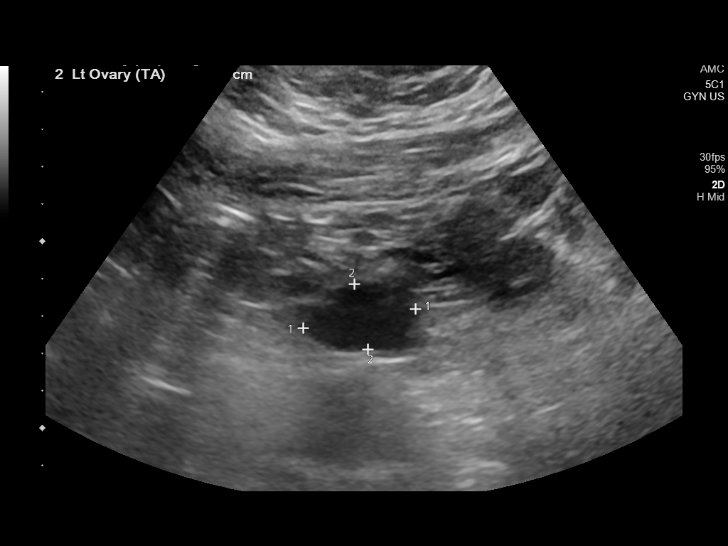
[im 21/50]
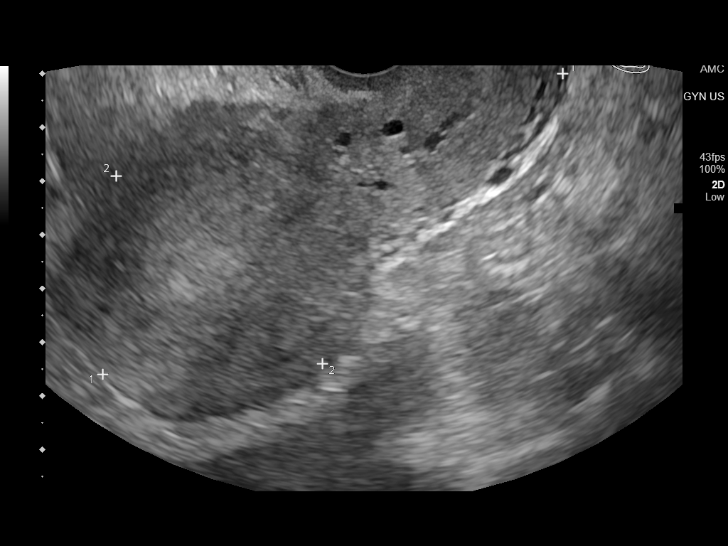
[im 25/50]
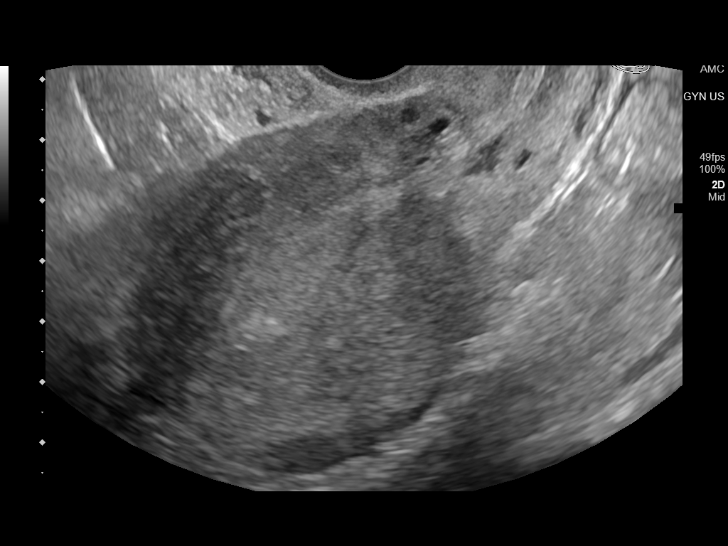
[im 29/50]
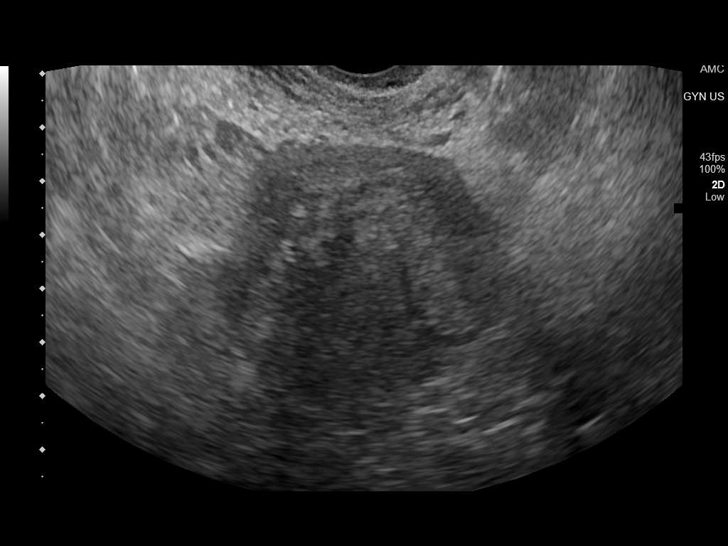
[im 33/50]
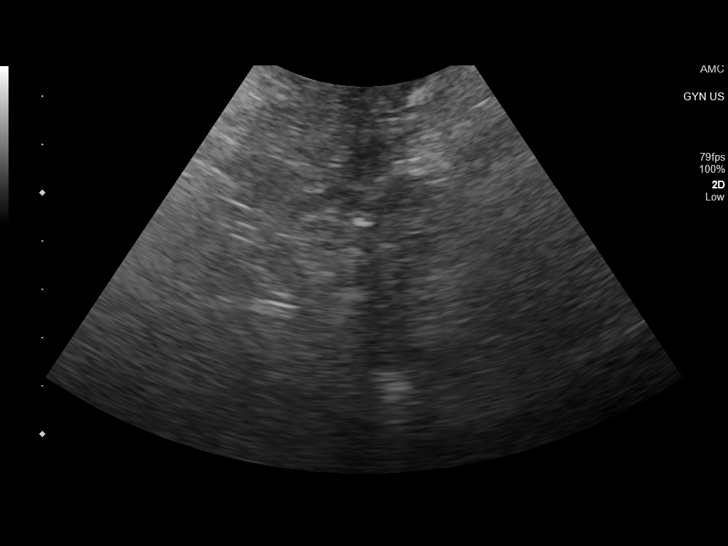
[im 37/50]
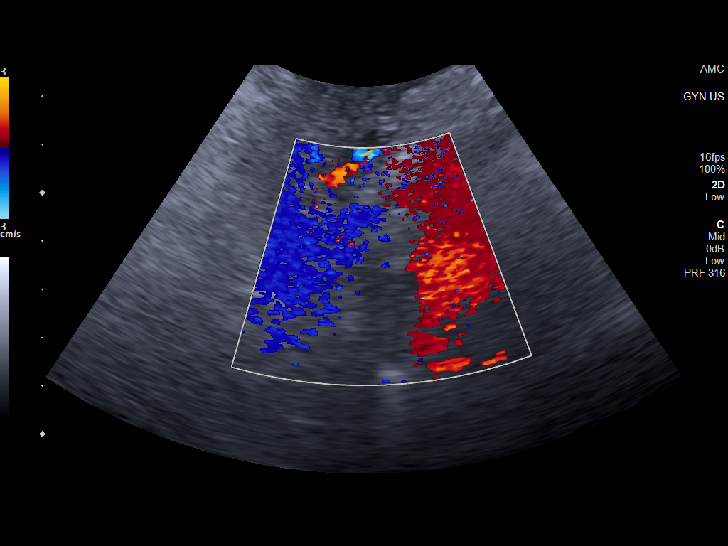
[im 41/50]
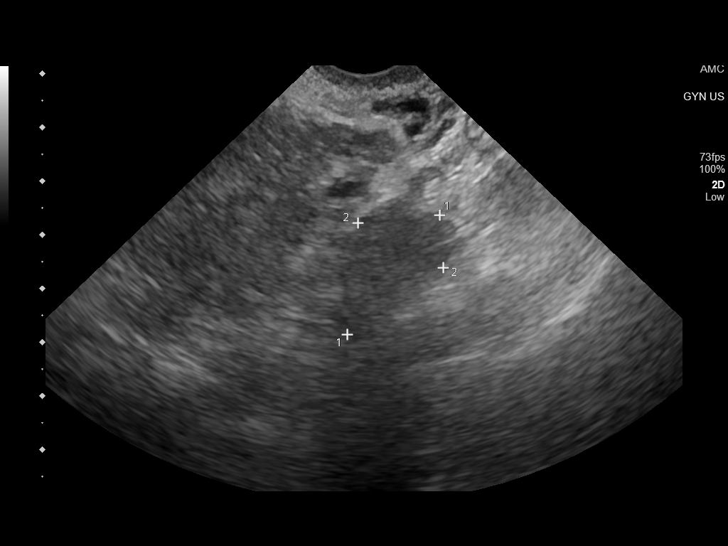
[im 45/50]
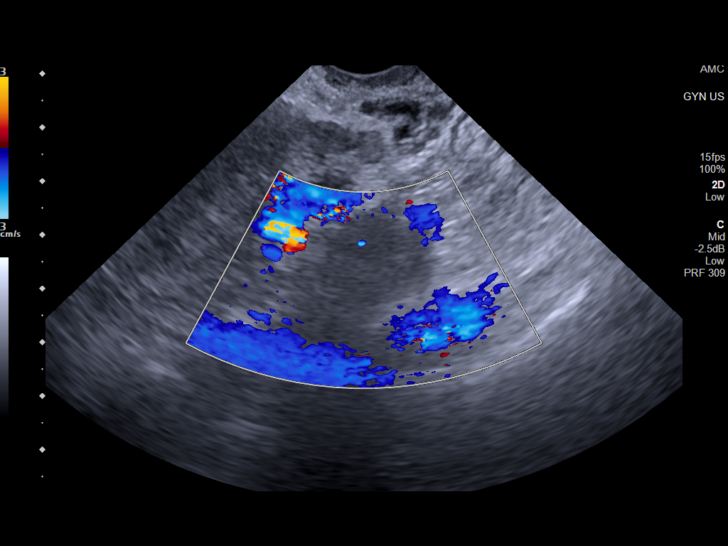
[im 50/50]
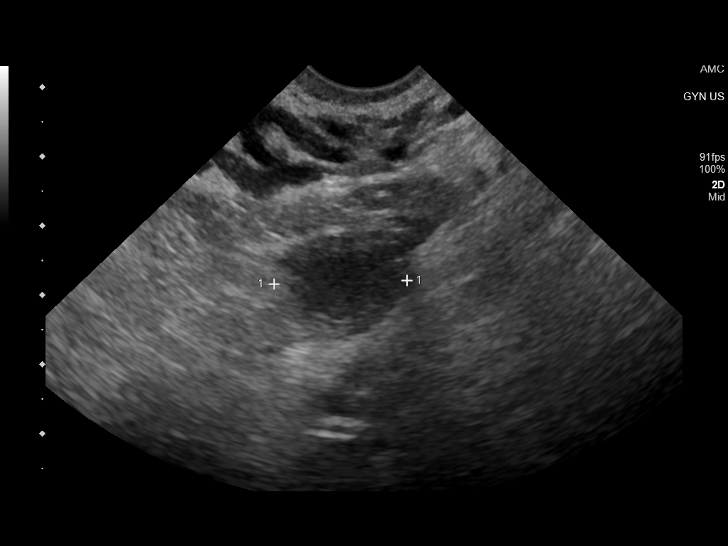

[13 of 25 positions shown; findings below may reference images not displayed]

FINDINGS: Uterus

Measurements: 10.2 x 5.2 x 5.6 cm = volume: 157 mL. No fibroids or
other mass visualized.

Endometrium

Thickness: 13.  No focal abnormality visualized.

Right ovary

Measurements: 3.5 x 1.5 x 1.9 cm = volume: 5.4 mL. Normal
appearance/no adnexal mass.

Left ovary

Measurements: 2.3 x 1.5 x 1.9 cm = volume: 5.1 mL. Normal
appearance/no adnexal mass.

Pulsed Doppler evaluation of the left ovary demonstrates normal
venous and arterial waveforms. The right ovary was suboptimally
evaluated.

Other findings

No abnormal free fluid.
IMPRESSION: 1. Habitus limited study.
2. No evidence for left-sided ovarian torsion. Arterial and venous
waveforms could not be adequately assessed on the right ovary. This
is felt to be secondary to difficulty imaging the right ovary and
less likely ovarian torsion given the normal size of the right
ovary.

## 2022-06-21 ENCOUNTER — Other Ambulatory Visit: Payer: Self-pay

## 2022-06-21 ENCOUNTER — Encounter: Payer: Self-pay | Admitting: Emergency Medicine

## 2022-06-21 DIAGNOSIS — N939 Abnormal uterine and vaginal bleeding, unspecified: Secondary | ICD-10-CM | POA: Insufficient documentation

## 2022-06-21 DIAGNOSIS — R8281 Pyuria: Secondary | ICD-10-CM | POA: Insufficient documentation

## 2022-06-21 DIAGNOSIS — Z8542 Personal history of malignant neoplasm of other parts of uterus: Secondary | ICD-10-CM | POA: Insufficient documentation

## 2022-06-21 LAB — CBC WITH DIFFERENTIAL/PLATELET
Abs Immature Granulocytes: 0.01 10*3/uL (ref 0.00–0.07)
Basophils Absolute: 0 10*3/uL (ref 0.0–0.1)
Basophils Relative: 0 %
Eosinophils Absolute: 0 10*3/uL (ref 0.0–0.5)
Eosinophils Relative: 1 %
HCT: 32.9 % — ABNORMAL LOW (ref 36.0–46.0)
Hemoglobin: 9.2 g/dL — ABNORMAL LOW (ref 12.0–15.0)
Immature Granulocytes: 0 %
Lymphocytes Relative: 21 %
Lymphs Abs: 1 10*3/uL (ref 0.7–4.0)
MCH: 19.6 pg — ABNORMAL LOW (ref 26.0–34.0)
MCHC: 28 g/dL — ABNORMAL LOW (ref 30.0–36.0)
MCV: 70.1 fL — ABNORMAL LOW (ref 80.0–100.0)
Monocytes Absolute: 0.5 10*3/uL (ref 0.1–1.0)
Monocytes Relative: 12 %
Neutro Abs: 3 10*3/uL (ref 1.7–7.7)
Neutrophils Relative %: 66 %
Platelets: 324 10*3/uL (ref 150–400)
RBC: 4.69 MIL/uL (ref 3.87–5.11)
RDW: 17.1 % — ABNORMAL HIGH (ref 11.5–15.5)
WBC: 4.6 10*3/uL (ref 4.0–10.5)
nRBC: 0 % (ref 0.0–0.2)

## 2022-06-21 NOTE — ED Triage Notes (Signed)
Pt to ED via POV with c/o bright red vaginal bleeding. Pt states at approx 1800 today bright red vaginal bleeding started. Pt states in December 2022 she had a hysterectomy and was dx with stage 1 endometrial cancer. Pt also c/o cramping at this time.   Pt c/o feeling weak and tired at this time.

## 2022-06-22 ENCOUNTER — Emergency Department: Payer: Medicaid Other

## 2022-06-22 ENCOUNTER — Emergency Department
Admission: EM | Admit: 2022-06-22 | Discharge: 2022-06-22 | Disposition: A | Discharge status: Home / Self Care | Payer: Medicaid Other | Attending: Emergency Medicine | Admitting: Emergency Medicine

## 2022-06-22 DIAGNOSIS — R8281 Pyuria: Secondary | ICD-10-CM

## 2022-06-22 DIAGNOSIS — N939 Abnormal uterine and vaginal bleeding, unspecified: Secondary | ICD-10-CM

## 2022-06-22 HISTORY — DX: Malignant neoplasm of endometrium: C54.1

## 2022-06-22 LAB — WET PREP, GENITAL
Clue Cells Wet Prep HPF POC: NONE SEEN
Sperm: NONE SEEN
Trich, Wet Prep: NONE SEEN
WBC, Wet Prep HPF POC: <10 — AB (ref <10)
Yeast Wet Prep HPF POC: NONE SEEN

## 2022-06-22 LAB — TYPE AND SCREEN
ABO/RH(D): AB POS
Antibody Screen: NEGATIVE

## 2022-06-22 LAB — COMPREHENSIVE METABOLIC PANEL
ALT: 17 U/L (ref 0–44)
AST: 25 U/L (ref 15–41)
Albumin: 4.2 g/dL (ref 3.5–5.0)
Alkaline Phosphatase: 69 U/L (ref 38–126)
Anion gap: 9 (ref 5–15)
BUN: 10 mg/dL (ref 6–20)
CO2: 24 mmol/L (ref 22–32)
Calcium: 8.9 mg/dL (ref 8.9–10.3)
Chloride: 103 mmol/L (ref 98–111)
Creatinine, Ser: 0.91 mg/dL (ref 0.44–1.00)
GFR, Estimated: >60 mL/min (ref >60)
Glucose, Bld: 124 mg/dL — ABNORMAL HIGH (ref 70–99)
Potassium: 3.3 mmol/L — ABNORMAL LOW (ref 3.5–5.1)
Sodium: 136 mmol/L (ref 135–145)
Total Bilirubin: 0.6 mg/dL (ref 0.3–1.2)
Total Protein: 8 g/dL (ref 6.5–8.1)

## 2022-06-22 LAB — URINALYSIS, COMPLETE (UACMP) WITH MICROSCOPIC
Bilirubin Urine: NEGATIVE
Glucose, UA: NEGATIVE mg/dL
Hgb urine dipstick: NEGATIVE
Ketones, ur: NEGATIVE mg/dL
Nitrite: NEGATIVE
Protein, ur: 30 mg/dL — AB
Specific Gravity, Urine: 1.03 (ref 1.005–1.030)
pH: 5 (ref 5.0–8.0)

## 2022-06-22 LAB — PROTIME-INR
INR: 1.1 (ref 0.8–1.2)
Prothrombin Time: 14.2 seconds (ref 11.4–15.2)

## 2022-06-22 LAB — CHLAMYDIA/NGC RT PCR (ARMC ONLY)
Chlamydia Tr: NOT DETECTED
N gonorrhoeae: NOT DETECTED

## 2022-06-22 MED ORDER — IBUPROFEN 600 MG PO TABS: 600.0000 mg | ORAL_TABLET | Freq: Three times a day (TID) | ORAL | 0 refills | Status: AC | PRN

## 2022-06-22 MED ORDER — KETOROLAC TROMETHAMINE 60 MG/2ML IM SOLN
60.0000 mg | Freq: Once | INTRAMUSCULAR | Status: AC
  Administered 2022-06-22: 60 mg via INTRAMUSCULAR
  Filled 2022-06-22: qty 2

## 2022-06-22 MED ORDER — CEPHALEXIN 500 MG PO CAPS: 500.0000 mg | ORAL_CAPSULE | Freq: Two times a day (BID) | ORAL | 0 refills | Status: AC

## 2022-06-22 NOTE — ED Provider Notes (Signed)
[facility] Provider Note    Event Date/Time   First MD Initiated Contact with Patient 06/22/22 0013     (approximate)   History   Vaginal Bleeding   HPI  Christine Anderson is a 37 y.o. female  here with vaginal bleednig. Pt reports that she is s/p hysterectomy. Earlier today she noticed a moderate amount of blood after using the bathroom and wiping. Denies any major pain with it. She denies any blood in her stools or BM at the time. No major urinary sx. She has not had any bleeding since her hysterectomy which was for stage I endometrial CA. No other major compliants. No recent trauma. No vaginal discharge. No recent sexual intercourse or trauma.       Physical Exam   Triage Vital Signs: ED Triage Vitals  Enc Vitals Group     BP 06/21/22 2325 (!) 142/74     Pulse Rate 06/21/22 2325 88     Resp 06/21/22 2325 18     Temp 06/21/22 2325 99.3 F (37.4 C)     Temp Source 06/22/22 0315 Oral     SpO2 06/21/22 2325 100 %     Weight 06/21/22 2328 (!) 334 lb (151.5 kg)     Height 06/21/22 2328 '5\' 7"'$  (1.702 m)     Head Circumference --      Peak Flow --      Pain Score 06/21/22 2327 10     Pain Loc --      Pain Edu? --      Excl. in [place]? --     Most recent vital signs: Vitals:   06/22/22 0400 06/22/22 0416  BP: 109/62   Pulse: 63 62  Resp:  19  Temp:  98.7 F (37.1 C)  SpO2: 95% 97%     General: Awake, no distress.  CV:  Good peripheral perfusion.  Resp:  Normal effort.  Abd:  No distention. No significant RLQ or LLQ TTP. No rebound or guarding. Other:  No blood noted in vaginal vault. Slight friabillity noted of vaginal cuff but no masses/lesions.    ED Results / Procedures / Treatments   Labs (all labs ordered are listed, but only abnormal results are displayed) Labs Reviewed  WET PREP, GENITAL - Abnormal; Notable for the following components:      Result Value   WBC, Wet Prep HPF POC <10 (*)    All other components within normal  limits  CBC WITH DIFFERENTIAL/PLATELET - Abnormal; Notable for the following components:   Hemoglobin 9.2 (*)    HCT 32.9 (*)    MCV 70.1 (*)    MCH 19.6 (*)    MCHC 28.0 (*)    RDW 17.1 (*)    All other components within normal limits  COMPREHENSIVE METABOLIC PANEL - Abnormal; Notable for the following components:   Potassium 3.3 (*)    Glucose, Bld 124 (*)    All other components within normal limits  URINALYSIS, COMPLETE (UACMP) WITH MICROSCOPIC - Abnormal; Notable for the following components:   Color, Urine YELLOW (*)    APPearance HAZY (*)    Protein, ur 30 (*)    Leukocytes,Ua TRACE (*)    Bacteria, UA RARE (*)    All other components within normal limits  CHLAMYDIA/NGC RT PCR ([facility] ONLY)            PROTIME-INR  HIV ANTIBODY (ROUTINE TESTING W REFLEX)  TYPE AND SCREEN     EKG  Normal sinus rhythm, VR 85. PR 130, QRS 78, QTc 433. No acute ST elevations or depressions.  RADIOLOGY US Pelvis: Trace simple free fluid adjacent to vaginal cuff, prior hysterectomy   I also independently reviewed and agree with radiologist interpretations.   PROCEDURES:  Critical Care performed: No   MEDICATIONS ORDERED IN ED: Medications  ketorolac (TORADOL) injection 60 mg (60 mg Intramuscular Given 06/22/22 0045)     IMPRESSION / MDM / ASSESSMENT AND PLAN / ED COURSE  I reviewed the triage vital signs and the nursing notes.                              Differential diagnosis includes, but is not limited to, irritation/bleeding from vaginal cuff, vaginitis, UTI, stone, vaginal lesion or trauma.  Patient's presentation is most consistent with acute presentation with potential threat to life or bodily function.  37 yo F here with vaginal bleeding, now resolved. No bleeding noted on exam here. Vaginal cuff appears largely unremarkable. Pelvic US shows a small amoutn of free fluid but no other issues. She has no signs to suggest cuff leak/rupture. She is HDS and in NAD. WBC normal.  UA shows mild pyuria, rare bacteria - will treat for possible cystitis. No signs of pyelo. Pt o/w stable. Will advise her to f/u with OB for repeat exam, return precautions given.   FINAL CLINICAL IMPRESSION(S) / ED DIAGNOSES   Final diagnoses:  Vaginal bleeding  Pyuria     Rx / DC Orders   ED Discharge Orders          Ordered    cephALEXin (KEFLEX) 500 MG capsule  2 times daily        06/22/22 0411    ibuprofen (ADVIL) 600 MG tablet  Every 8 hours PRN        06/22/22 0411             Note:  This document was prepared using Dragon voice recognition software and may include unintentional dictation errors.   Duffy Bruce, MD 06/22/22 [phone]

## 2022-06-22 NOTE — Discharge Instructions (Addendum)
Take the antibiotic for possible UTI, which could cause bleeding  Follow-up with OBGYN in 1 week.

## 2022-06-23 LAB — HIV ANTIBODY (ROUTINE TESTING W REFLEX): HIV Screen 4th Generation wRfx: NONREACTIVE

## 2023-01-04 ENCOUNTER — Other Ambulatory Visit: Payer: Self-pay

## 2023-01-04 ENCOUNTER — Emergency Department: Payer: Medicaid Other

## 2023-01-04 ENCOUNTER — Emergency Department
Admission: EM | Admit: 2023-01-04 | Discharge: 2023-01-04 | Disposition: A | Discharge status: Home / Self Care | Payer: Medicaid Other | Attending: Emergency Medicine | Admitting: Emergency Medicine

## 2023-01-04 DIAGNOSIS — Z23 Encounter for immunization: Secondary | ICD-10-CM | POA: Insufficient documentation

## 2023-01-04 DIAGNOSIS — S0101XA Laceration without foreign body of scalp, initial encounter: Secondary | ICD-10-CM | POA: Diagnosis not present

## 2023-01-04 DIAGNOSIS — Y92524 Gas station as the place of occurrence of the external cause: Secondary | ICD-10-CM | POA: Insufficient documentation

## 2023-01-04 DIAGNOSIS — S0990XA Unspecified injury of head, initial encounter: Secondary | ICD-10-CM | POA: Diagnosis present

## 2023-01-04 MED ORDER — CEPHALEXIN 500 MG PO CAPS: 500.0000 mg | ORAL_CAPSULE | Freq: Two times a day (BID) | ORAL | 0 refills | Status: AC

## 2023-01-04 MED ORDER — ACETAMINOPHEN 500 MG PO TABS
1000.0000 mg | ORAL_TABLET | Freq: Once | ORAL | Status: AC
  Administered 2023-01-04: 1000 mg via ORAL
  Filled 2023-01-04: qty 2

## 2023-01-04 MED ORDER — ACETAMINOPHEN 500 MG PO TABS: 1000.0000 mg | ORAL_TABLET | Freq: Four times a day (QID) | ORAL | 0 refills | Status: AC | PRN

## 2023-01-04 MED ORDER — LIDOCAINE-EPINEPHRINE (PF) 2 %-1:200000 IJ SOLN
10.0000 mL | Freq: Once | INTRAMUSCULAR | Status: AC
  Administered 2023-01-04: 10 mL
  Filled 2023-01-04: qty 20

## 2023-01-04 MED ORDER — TETANUS-DIPHTH-ACELL PERTUSSIS 5-2.5-18.5 LF-MCG/0.5 IM SUSY
0.5000 mL | PREFILLED_SYRINGE | Freq: Once | INTRAMUSCULAR | Status: AC
  Administered 2023-01-04: 0.5 mL via INTRAMUSCULAR
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
## 2023-01-04 NOTE — Discharge Instructions (Addendum)
Turn in 7 to 10 days for staple removal.  Can return to this ED or primary care.  Take antibiotics as instructed.  Please read through laceration care education packet provided.

## 2023-01-04 NOTE — ED Provider Notes (Signed)
Shared visit  Scalp laceration.  CT scan of the head without signs of intracranial hemorrhage or infarction.  No concern for basilar skull fracture.  Laceration was repaired by staples.  Given return precautions and discussed when staples need to be removed.  Tetanus updated.   Corena Herter, MD 01/04/23 1946
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
## 2023-01-10 ENCOUNTER — Emergency Department
Admission: EM | Admit: 2023-01-10 | Discharge: 2023-01-10 | Disposition: A | Discharge status: Home / Self Care | Payer: Medicaid Other | Attending: Student in an Organized Health Care Education/Training Program | Admitting: Student in an Organized Health Care Education/Training Program

## 2023-01-10 DIAGNOSIS — Z4802 Encounter for removal of sutures: Secondary | ICD-10-CM | POA: Insufficient documentation

## 2023-01-10 DIAGNOSIS — Z5189 Encounter for other specified aftercare: Secondary | ICD-10-CM

## 2023-01-10 DIAGNOSIS — S0101XD Laceration without foreign body of scalp, subsequent encounter: Secondary | ICD-10-CM | POA: Insufficient documentation

## 2023-01-10 NOTE — ED Notes (Signed)
Pt presents to ED with /co of needing staple removal from back of head, pt denies any other issues. NAD noted.

## 2023-01-10 NOTE — ED Notes (Signed)
PT is here to get 5 staples removed from the back of her head. Staples have been in since last Sunday.
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
## 2023-01-10 NOTE — Discharge Instructions (Signed)
Return for any new, worsening, or change in symptoms or other concerns.  It was a pleasure caring for you today.
